# Patient Record
Sex: Female | Born: 1945
Health system: Southern US, Community
[De-identification: ages and names within clinical notes are randomized; demographics above are authoritative.]

## PROBLEM LIST (undated history)

## (undated) DIAGNOSIS — M810 Age-related osteoporosis without current pathological fracture: Secondary | ICD-10-CM

## (undated) DIAGNOSIS — R42 Dizziness and giddiness: Secondary | ICD-10-CM

## (undated) DIAGNOSIS — C679 Malignant neoplasm of bladder, unspecified: Secondary | ICD-10-CM

## (undated) DIAGNOSIS — I10 Essential (primary) hypertension: Secondary | ICD-10-CM

## (undated) DIAGNOSIS — I2510 Atherosclerotic heart disease of native coronary artery without angina pectoris: Secondary | ICD-10-CM

## (undated) HISTORY — PX: RETINAL DETACHMENT SURGERY: SHX105

## (undated) HISTORY — DX: Atherosclerotic heart disease of native coronary artery without angina pectoris: I25.10

## (undated) HISTORY — PX: ABDOMINAL HYSTERECTOMY: SHX81

## (undated) HISTORY — DX: Malignant neoplasm of bladder, unspecified: C67.9

## (undated) HISTORY — DX: Dizziness and giddiness: R42

## (undated) HISTORY — DX: Essential (primary) hypertension: I10

## (undated) HISTORY — PX: TRANSURETHRAL RESECTION OF BLADDER: SUR1395

## (undated) HISTORY — DX: Age-related osteoporosis without current pathological fracture: M81.0

---

## 2007-05-16 ENCOUNTER — Ambulatory Visit (HOSPITAL_COMMUNITY): Admission: RE | Admit: 2007-05-16 | Discharge: 2007-05-16 | Payer: Self-pay | Admitting: Ophthalmology

## 2007-06-25 ENCOUNTER — Ambulatory Visit (HOSPITAL_COMMUNITY): Admission: RE | Admit: 2007-06-25 | Discharge: 2007-06-25 | Payer: Self-pay | Admitting: Ophthalmology

## 2007-07-02 ENCOUNTER — Ambulatory Visit (HOSPITAL_COMMUNITY): Admission: RE | Admit: 2007-07-02 | Discharge: 2007-07-02 | Payer: Self-pay | Admitting: Ophthalmology

## 2007-07-23 ENCOUNTER — Ambulatory Visit (HOSPITAL_COMMUNITY): Admission: RE | Admit: 2007-07-23 | Discharge: 2007-07-23 | Payer: Self-pay | Admitting: Ophthalmology

## 2007-11-19 ENCOUNTER — Ambulatory Visit (HOSPITAL_COMMUNITY): Admission: RE | Admit: 2007-11-19 | Discharge: 2007-11-19 | Payer: Self-pay | Admitting: Ophthalmology

## 2010-08-24 NOTE — Op Note (Signed)
[name], [name]             ACCOUNT NO.:  [id number]   MEDICAL RECORD NO.:  [id number]          PATIENT TYPE:  AMB   LOCATION:  SDS                          FACILITY:  [facility]   PHYSICIAN:  Jillyn Hidden A. Rankin, M.D.   DATE OF BIRTH:  03-07-1946   DATE OF PROCEDURE:  11/19/2007  DATE OF DISCHARGE:  11/19/2007                               OPERATIVE REPORT   PREOPERATIVE DIAGNOSES:  1. Tractional detachment of right eye.  2. Rhegmatogenous detachment of right eye.  3. Retained silicone oil implant, right eye.  4. Posterior synechiae.   POSTOPERATIVE DIAGNOSES:  1. Tractional detachment of right eye.  2. Rhegmatogenous detachment of right eye.  3. Retained silicone oil implant, right eye.  4. Posterior synechiae to lens capsule from iris at the 11 o'clock      position.   PROCEDURE:  1. Posterior vitrectomy-25 gauge right eye.  2. Removal of posterior implant - oil, right eye.  3. Posterior synechialysis, right eye, in the anterior chamber.   SURGEON:  Alford Highland. Rankin, MD.   ANESTHESIA:  Local with monitored anesthesia control.   INDICATION FOR PROCEDURE:  The patient is a 65 year old woman who has  had a prior history of complex retinal detachment repair requiring use  of silicone oil in addition to vitrectomy and laser photocoagulation.  This is an attempt to remove the silicone oil so as to minimize any  toxic issues that might come from the oil but also to regain best  corrected surgical vision in the left eye.  The patient understands the  risk of anesthesia, including rare occurrence of death, but also to the  eye from the hemorrhage from the underlying condition as well as  surgical repair, including but not limited to hemorrhage, infection,  scarring, need for another surgery, no change in vision, loss of vision,  and progressive disease despite intervention.   Appropriate signed consent was obtained.  The patient was taken to  operating room.  In the operating room,  appropriate monitors followed by  mild sedation.  A 2% Xylocaine injected  5 mL retrobulbar with  additional 5 mL laterally in fashion of modified Darel Hong.  The right  periocular region was then sterilely prepped and draped in the usual  sterile  ophthalmic fashion.  Lid speculum was applied.  A 25-gauge  infusion cannula was placed in the inferotemporal quadrant.  A superior  trocar was applied.  Core vitrectomy was not necessary, had been  previously done.  The supratemporal sclerotomy was not placed at this  time.  __________ cut down, conjunctival peritomy was then fashioned and  an MVR blade was used into the vitreous cavity.  Silicone oil extraction  was then carried out through the supratemporal quadrant.  The oil was  removed without difficulty.  There were some remnants, and this was  decided to perform a fluid-air exchange.  The sclerotomy was then closed  with several Vicryl and the remaining 25-gauge trocar placed in the  supratemporal quadrant.  This allowed for closed vitrectomy system to be  undertaken.  Fluid-air exchange completed x2 to remove the  oil from the  meniscus of the BSS.  Vast majority of oil was removed in this fashion.  BSS was left in place without air.  At this time, superior trocars were  removed.  MVR blade was then used to create a small paracentesis  incision at the 6:30 position and then cycle dialysis spatula sweep was  then used to break the synechiae at 11 o'clock position, iris to the  capsule.  Free excursion in the iris was now possible.  At this time,  the anterior chamber was of appropriate depth.  The infusion is removed.  The conjunctiva was closed with 7-0 Vicryl.  Subconjunctival Decadron  applied.  Sterile patch and Fox shield applied.  The patient was taken  to short stay area, discharged home as an outpatient.      Alford Highland Rankin, M.D.  Electronically Signed     GAR/MEDQ  D:  11/19/2007  T:  11/20/2007  Job:  [id number]

## 2010-08-24 NOTE — Op Note (Signed)
[name], [name]             ACCOUNT NO.:  [id number]   MEDICAL RECORD NO.:  [id number]          PATIENT TYPE:  AMB   LOCATION:  SDS                          FACILITY:  [facility]   PHYSICIAN:  Jillyn Hidden A. Rankin, M.D.   DATE OF BIRTH:  1945-06-17   DATE OF PROCEDURE:  06/25/2007  DATE OF DISCHARGE:  06/25/2007                               OPERATIVE REPORT   PREOPERATIVE DIAGNOSIS:  1. Nuclear sclerotic cataract, OD.  2. 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   POSTOPERATIVE DIAGNOSIS:  1. Nuclear sclerotic cataract, OD.  2. 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   PROCEDURE:  Phacoemulsification and cataract extraction extracapsular  with insertion of posterior chamber intraocular lens, OD.  Alcon model  [id number] power +19.0 Diopter.  Serial number [id number].   ANESTHESIA:  Local retrobulbar and monitored anesthesia care.   SURGEON:  Alford Highland. Rankin, M.D.   INDICATIONS FOR PROCEDURE:  The patient is a 65 year old woman with  moderate nuclear sclerotic cataract in the right eye which requires  vitrectomy, repair of retinal detachment, and the cataract surgery needs  to be done first so as to allow for complete vitrectomy and removal of  membranes in the right eye in the coming week or two.  The patient  understands the risks of anesthesia including the rare occurrence of  death, but also to the eye including, but not limited to hemorrhage,  infection, scarring, need for  another surgery, no change in vision, loss  of vision, progression of disease despite intervention.  Appropriate  signed consent was obtained.   DESCRIPTION OF PROCEDURE:  The patient was taken to the operating room.  In the operating room, appropriate monitors followed by mild sedation.  2% Xylocaine injected retrobulbar 5 mL with additional 5 mL lateral  fashion modified Darel Hong.  The right periocular region was sterilely  prepped and draped in the usual ophthalmic fashion.  The lid speculum  was applied.  The diamond blade was then used to create a biplanar clear  corneal incision superotemporally.  Anterior chamber was deepened with  Provisc.  Cystotome needle was then placed.  Entry was made in the  anterior capsule and capsulorrhexis forceps were then used to create a  continuous circular capsulorrhexis without difficulty.  Hydrodissection  delineation carried out in the bag.  Phaco fragmentation was then  carried out.  Power and duration of phaco time was less than 1.38  minutes.   At  this time, IA cortical cleanup was then carried out without  difficulty.  At this time the intraocular lens was loaded, inserted into  the anterior chamber, and rotated to horizontal position with excellent  centration.  No complications occurred.  Provisc had been used to deepen  the anterior chamber and the capsular bag prior to  insertion of the lens.  This was now aspirated with IA.  Safety stitch  10-0 nylon was then placed solitarily in the clear cornea.  Miochol was  then used to close down the pupil.  Subconjunctival Decadron applied.  Sterile patch and Fox shield applied.  The patient was taken to the  short stay area and discharged home as an outpatient.      Alford Highland Rankin, M.D.  Electronically Signed     GAR/MEDQ  D:  06/25/2007  T:  06/25/2007  Job:  [id number]

## 2010-08-24 NOTE — Op Note (Signed)
[name], [name]             ACCOUNT NO.:  [id number]   MEDICAL RECORD NO.:  [id number]          PATIENT TYPE:  AMB   LOCATION:  DFTL                         FACILITY:  [facility]   PHYSICIAN:  Alford Highland. Rankin, M.D.   DATE OF BIRTH:  1945-06-25   DATE OF PROCEDURE:  05/16/2007  DATE OF DISCHARGE:  05/16/2007                               OPERATIVE REPORT   PREOPERATIVE DIAGNOSIS:  Rhegmatogenal detachment - macula off right  eye.   POSTOPERATIVE DIAGNOSIS:  Rhegmatogenal detachment - macula off right  eye.   PROCEDURE:  1. Scleral buckle using a 270, 287, and 40 element with retinal      cryopexy.  2. External drainage of subretinal fluid.   INDICATIONS FOR PROCEDURE:  The patient is a 65 year old woman who has  profound vision loss in the right eye on the basis on rhegmatogenal  detachment.  This is an attempt to reattach the retina.  She understands  the fundamental nature of the disorder and the requirement to repair it  to put the retina back in place so the healing process can allow for  visual acuity improvement over the next weeks to months.  She  understands the risks of anesthesia including the rare occurrence of  death but also to the eye including but not limited to hemorrhage,  infection, scarring, need for further surgery, no change in vision, loss  of vision, and progression of disease despite intervention.  After  appropriate signed consent was obtained, the patient was taken to the  operating room.   DESCRIPTION OF PROCEDURE:  In the operating room put monitors followed  by mild sedation.  General endotracheal anesthesia was then instituted.  The right periocular region was identified and then sterilely prepped  and draped in the usual opthalmic fashion.  Lid speculum applied.  Conjunctival peritomy was then fashioned 360 degrees.  Rectus muscles  isolated on 2-0 silk ties.  Indirect ophthalmoscopy was then performed  which confirmed the presence of a retinal  tear at the 10:30 position.  This large retinal tear was treated with retinal cryopexy.  Suspicious  tear was noted at 12 o'clock.  Detachment extended from the 8 o'clock  position superiorly around to the 12:30 position but the macula was off  and the highly bullous nature of the detachment had diminished  substantially.  287 silicon explant was selected and placed from the 7  o'clock position temporally to the 12 and then over to the 2 o'clock  position.  A 240 encircling band was used and it was joined end-to-end  on the inferonasal quadrant with a 70 Watzke sleeve.  At this time,  external drainage of subretinal fluid was then carried out with  visualization via the ophthalmoscopy.  This was carried out in the  inferotemporal quadrant.  Very little bit of fluid was remaining and the  tear settled nicely on the buckle and all subretinal fluid visible was  removed.   At this time, bed of the buckle was irrigated with bug juice.  Buckle  was tightened to the appropriate tension and the sutures were tied  permanently.  Two sutures were placed in the superotemporal quadrant,  one in each of the remaining quadrants.  At this time, optic nerve was  checked and it was found to be profuse.  The conjunctiva was  then brought forward and closed with interrupted 7-0 Vicryl sutures with  tenons as well.  Subconjunctival Decadron applied.  Sterile patch and  Fox shield were applied to the right eye.  The patient tolerated the  procedure without complications.  The patient was then taken to the PACU  in good stable condition.      Alford Highland Rankin, M.D.  Electronically Signed     GAR/MEDQ  D:  05/16/2007  T:  05/17/2007  Job:  [id number]   cc:   Dorris Fetch, M.D.

## 2010-08-24 NOTE — Op Note (Signed)
NAMEABYGAYLE, DELTORO             ACCOUNT NO.:  [id number]   MEDICAL RECORD NO.:  [id number]          PATIENT TYPE:  AMB   LOCATION:  SDS                          FACILITY:  [facility]   PHYSICIAN:  Jillyn Hidden A. Rankin, M.D.   DATE OF BIRTH:  05/13/1945   DATE OF PROCEDURE:  07/23/2007  DATE OF DISCHARGE:  07/23/2007                               OPERATIVE REPORT   PREOPERATIVE DIAGNOSES:  1. Tractional detachment, right eye.  2. Proliferative vitreoretinopathy stage C2 right eye, recurrent      retinal detachment.  3. Rhegmatogenous detachment, right eye, combined with proliferative      vitreoretinopathy stage C2 right eye recurrent retinal detachment.   POSTOPERATIVE DIAGNOSES:  1. Tractional detachment, right eye.  2. Proliferative vitreoretinopathy stage C2 right eye, recurrent      retinal detachment.  3. Rhegmatogenous detachment, right eye, combined with proliferative      vitreoretinopathy stage C2 right eye recurrent retinal detachment.   PROCEDURES:  1. Posterior vitrectomy with membrane peel - to repair retinal      detachment, complex.  2. Injection of vitreous substitute - air.  3. Focal laser photocoagulation.  4. Injection of vitreous substitute permanent - silicone oil 5000      [place], the right eye.  This is all 20-gauge surgery.   SURGEON:  Alford Highland. Rankin, M.D.   ANESTHESIA:  Local retrobulbar, monitored anesthesia control.   INDICATIONS FOR PROCEDURE:  The patient is a 65 year old woman who has  recurrent combined rhegmatogenous detachment and tractional detachment,  right eye.  The patient had recent vitrectomy membrane peel with  placement of gas and redetachment developed with new retinal breaks off  the slope of the buckle temporarily.  This is an attempt to reattach the  retina.  She understands the risks of anesthesia including rare  occurrence of death, loss of the eye, including but not limited to  hemorrhage, infection, scarring, need for  another  surgery, no change in  vision, loss of vision, and progressive disease despite intervention.  After appropriate signed consent was obtained, the patient taken to the  operating room.  In the operating room, appropriate monitors followed by  mild sedation.  Xylocaine  2% 5 mL was injected retrobulbar, followed by  an additional  5 mL laterally in the fashion of modified Darel Hong.  The  right periocular region was sterilely prepped and draped usual  ophthalmic fashion.  A lid speculum was applied.  Conjunctival openings  were made inferotemporal, superotemporal, and superonasal.  A 20 gauge  infusion cast 3.5 mm posterior to the limbus.  Placement of vitreous  cavity verified visually.  Superior sclerotomies fashioned.  Microscope  was  was placed in position with the biome attachment.  Core vitrectomy  had been previously done and thus attention was drawn to vitreous skirt.  It was noted that there is excellent vitreous skirt dissection  from  previous surgery.  There is very little in the way of epiretinal tissue,  but there was rounded edges to the retinal hole at the 8:30 position  signifying development of  proliferative vitreoretinopathy.  The  inferior retinal detachment was noted.  A retinotomy was fashioned nasal  to the optic nerve.  This allowed the areas of subretinal fluid.  Thick  subretinal fluid had been carried out and drained in a fluid-fluid  maneuver prior to fluid-air exchange.  Fluid air exchange carried out.  All subretinal fluid was removed in this fashion.  This time endolaser  photocoagulation placed at the bed attachment as well as around  chorioretinal scars around the retinal break noted.  Excellent laser  adhesion appeared to be taking.  Laser was applied around the retinotomy  sites.  Thereafter multiple reaccumulation of fluids were aspirated.  Thereafter, air-silicone oil exchange completed passively.  The retina  remained nicely reattached.  Superior  sclerotomy was closed with  7-0  Vicryl.  The infusion removed similarly closed.  Conjunctiva closed with  7-0 Vicryl.  At this time,subconjunctival Decadron applied.  Sterile  patch and Fox shield applied.  The patient taken to short-stay area  after tolerating procedure without complication.      Alford Highland Rankin, M.D.  Electronically Signed     GAR/MEDQ  D:  07/23/2007  T:  07/24/2007  Job:  [id number]

## 2010-08-24 NOTE — Op Note (Signed)
[name], [name]             ACCOUNT NO.:  [id number]   MEDICAL RECORD NO.:  [id number]          PATIENT TYPE:  AMB   LOCATION:  SDS                          FACILITY:  [facility]   PHYSICIAN:  [name], M.D.   DATE OF BIRTH:  Jun 12, 1945   DATE OF PROCEDURE:  07/02/2007  DATE OF DISCHARGE:                               OPERATIVE REPORT   PREOPERATIVE DIAGNOSES:  1. Tractional detachment, right eye.  2. Proliferative visual retinopathy stage C2, inferior detachment      macula on.   POSTOPERATIVE DIAGNOSES:  1. Tractional detachment, right eye.  2. Proliferative vitreal retinopathy stage C2, inferior detachment      macula on.   PROCEDURES:  1. Posterior vitrectomy, membrane peel.  2. Repair of retinal detachment, right eye and star fold that is found      at the 7:30 position off the slope of the buckle.  3. Endolaser pan photocoagulation into the bed of detachment as well      as around the retina to be fashioned outside the inferotemporal      arcade.  4. Injection of vitreous __________ C3F8 10%.   SURGEON:  Heidi Kirk, MD.   ANESTHESIA:  Local retrobulbar monitored anesthesia control.   INDICATION FOR PROCEDURE:  The patient is a 65 year old woman who has  profound vision loss in the right eye on the basis of retinal detachment  which has recurred and then subsequently required cataract extraction  with intraocular lens placement last week in order to allow appropriate  proliferative vitreal retinopathy treatment via vitrectomy, dissection  of the vitreous base, release of the tractional folds.  The patient  understands the risk of this surgery to repair the retinal detachment  and the urgency to repair the retinal detachment in order to improve  visual functioning and maintain long term visual care.  She understands  the risks of anesthesia including the risk of death, but also to the eye  including, but not limited to, hemorrhage, infection, scarring, need  for  another surgery, no change in vision, loss of vision, progressive  disease despite intervention.   OPERATIVE NOTE:  After appropriate signed consent was obtained, the  patient was taken to the operating room.  In the operating room  appropriate monitors followed by mild sedation.  Xylocaine 2% injected 5  mL retrobulbar with an additional 5 mL laterally __________ modified [name].  The right periocular region was sterilely prepped and draped in  the usual ophthalmic fashion.  A lid speculum applied.  At this time a  conjunctival peritomy was then fashioned temporally and superonasally.  Infusion cannula was secured 3.5 mm posterior to the limbus in the upper  __________.  Placement of the vitreous cavity verified visually.  Superior sclerotomies were then fashioned __________ attached.  Core  vitrectomy was then begun after the superior sclerotomies had been  fashioned.  No findings of significant pigment dispersion.   The vitreous base was then trimmed 360 degrees using __________  techniques.  The anterior hyaloid was confirmed to be removed.  At this  time a small retinotomy along  the __________ temporal arcade was  created.  Fluid exchange was then carried out to help flatten the  retina.  At this time a star fold along the __________ buckle, which had  been identified preoperatively, was engaged and forceps extraction was  then used to mobilize __________ tissues as well as to develop some  mobility, freedom in this area for the retina.   At this time, fluid air exchange completed.  All subretinal fluid was  removed.  Endolaser photocoagulation and a retinopexy __________  detachment posterior to the pars plana, along the pars plana and on the  bed of detachment around the retinotomy site was then carried out.  All  fluid was removed.  The retina remained nicely attached.  An AR-C3F8 10%  exchange was then completed.  Superior sclerotomy was closed with 7-0  Vicryl.   __________ similarly closed.  Conjunctiva closed with 7-0  Vicryl.  Subconjunctival injections of __________ were applied.  A  sterile patch and Fox shield applied.  Intraocular pressure assessed,  found to be adequate.  The patient was taken to the PACU and then to  Short Stay in good stable condition after tolerating procedure well, no  complications.      [name], M.D.  Electronically Signed     GAR/MEDQ  D:  07/02/2007  T:  07/02/2007  Job:  [id number]

## 2010-12-31 LAB — BASIC METABOLIC PANEL
BUN: 10
CO2: 24
Chloride: 106
GFR calc non Af Amer: >60
Glucose, Bld: 95
Potassium: 4.1

## 2010-12-31 LAB — CBC
HCT: 39.6
MCHC: 34.4
MCV: 93.5
Platelets: 263
RDW: 12.9

## 2011-01-03 LAB — BASIC METABOLIC PANEL
BUN: 10
Calcium: 10.4
Creatinine, Ser: 0.66
GFR calc non Af Amer: >60
Glucose, Bld: 103 — ABNORMAL HIGH

## 2011-01-03 LAB — CBC
Platelets: 296
RDW: 12.7
WBC: 9.5

## 2011-01-04 LAB — BASIC METABOLIC PANEL
Chloride: 106
GFR calc Af Amer: >60
GFR calc non Af Amer: >60
Potassium: 4.5

## 2011-01-04 LAB — CBC
HCT: 39.1
MCV: 92.3
Platelets: 304
RBC: 4.24
WBC: 8.1

## 2011-01-07 LAB — CBC
HCT: 40.4
Hemoglobin: 13.5
MCV: 93.5
Platelets: 252
WBC: 8.5

## 2011-01-07 LAB — BASIC METABOLIC PANEL
BUN: 9
Chloride: 105
Creatinine, Ser: 0.75
Glucose, Bld: 95
Potassium: 4.7

## 2012-10-23 ENCOUNTER — Encounter (HOSPITAL_COMMUNITY): Payer: Self-pay | Admitting: Pharmacy Technician

## 2012-10-23 NOTE — Patient Instructions (Addendum)
AVALON COPPINGER  10/23/2012   Your procedure is scheduled on: 10/30/12  Report to [facility] at 0730 AM.  Call this number if you have problems the morning of surgery: [phone]   Remember:   Do not eat food or drink liquids after midnight.   Take these medicines the morning of surgery with A SIP OF WATER: lisinopril   Do not wear jewelry, make-up or nail polish.  Do not wear lotions, powders, or perfumes. You may wear deodorant.  Do not shave 48 hours prior to surgery. Men may shave face and neck.  Do not bring valuables to the hospital.  [facility] is not responsible                   for any belongings or valuables.  Contacts, dentures or bridgework may not be worn into surgery.  Leave suitcase in the car. After surgery it may be brought to your room.  For patients admitted to the hospital, checkout time is 11:00 AM the day of  discharge.   Patients discharged the day of surgery will not be allowed to drive  home.  Name and phone number of your driver: family  Special Instructions: Shower using CHG 2 nights before surgery and the night before surgery.  If you shower the day of surgery use CHG.  Use special wash - you have one bottle of CHG for all showers.  You should use approximately 1/3 of the bottle for each shower.   Please read over the following fact sheets that you were given: Pain Booklet, Surgical Site Infection Prevention, Anesthesia Post-op Instructions and Care and Recovery After Surgery  Cataract A cataract is a clouding of the lens of the eye. When a lens becomes cloudy, vision is reduced based on the degree and nature of the clouding. Many cataracts reduce vision to some degree. Some cataracts make people more near-sighted as they develop. Other cataracts increase glare. Cataracts that are ignored and become worse can sometimes look white. The white color can be seen through the pupil. CAUSES   Aging. However, cataracts may occur at any age, even in  newborns.  Certain drugs.  Trauma to the eye.  Certain diseases such as diabetes.  Specific eye diseases such as chronic inflammation inside the eye or a sudden attack of a rare form of glaucoma.  Inherited or acquired medical problems. SYMPTOMS   Gradual, progressive drop in vision in the affected eye.  Severe, rapid visual loss. This most often happens when trauma is the cause. DIAGNOSIS  To detect a cataract, an eye doctor examines the lens. Cataracts are best diagnosed with an exam of the eyes with the pupils enlarged (dilated) by drops.  TREATMENT  For an early cataract, vision may improve by using different eyeglasses or stronger lighting. If that does not help your vision, surgery is the only effective treatment. A cataract needs to be surgically removed when vision loss interferes with your everyday activities, such as driving, reading, or watching TV. A cataract may also have to be removed if it prevents examination or treatment of another eye problem. Surgery removes the cloudy lens and usually replaces it with a substitute lens (intraocular lens, IOL).  At a time when both you and your doctor agree, the cataract will be surgically removed. If you have cataracts in both eyes, only one is usually removed at a time. This allows the operated eye to heal and be out of danger from any possible problems after  surgery (such as infection or poor wound healing). In rare cases, a cataract may be doing damage to your eye. In these cases, your caregiver may advise surgical removal right away. The vast majority of people who have cataract surgery have better vision afterward. HOME CARE INSTRUCTIONS  If you are not planning surgery, you may be asked to do the following:  Use different eyeglasses.  Use stronger or brighter lighting.  Ask your eye doctor about reducing your medicine dose or changing medicines if it is thought that a medicine caused your cataract. Changing medicines does not  make the cataract go away on its own.  Become familiar with your surroundings. Poor vision can lead to injury. Avoid bumping into things on the affected side. You are at a higher risk for tripping or falling.  Exercise extreme care when driving or operating machinery.  Wear sunglasses if you are sensitive to bright light or experiencing problems with glare. SEEK IMMEDIATE MEDICAL CARE IF:   You have a worsening or sudden vision loss.  You notice redness, swelling, or increasing pain in the eye.  You have a fever. Document Released: 03/28/2005 Document Revised: 06/20/2011 Document Reviewed: 11/19/2010 [facility] Patient Information 2014 [place], [place].  PATIENT INSTRUCTIONS POST-ANESTHESIA  IMMEDIATELY FOLLOWING SURGERY:  Do not drive or operate machinery for the first twenty four hours after surgery.  Do not make any important decisions for twenty four hours after surgery or while taking narcotic pain medications or sedatives.  If you develop intractable nausea and vomiting or a severe headache please notify your doctor immediately.  FOLLOW-UP:  Please make an appointment with your surgeon as instructed. You do not need to follow up with anesthesia unless specifically instructed to do so.  WOUND CARE INSTRUCTIONS (if applicable):  Keep a dry clean dressing on the anesthesia/puncture wound site if there is drainage.  Once the wound has quit draining you may leave it open to air.  Generally you should leave the bandage intact for twenty four hours unless there is drainage.  If the epidural site drains for more than 36-48 hours please call the anesthesia department.  QUESTIONS?:  Please feel free to call your physician or the hospital operator if you have any questions, and they will be happy to assist you.

## 2012-10-24 ENCOUNTER — Encounter (HOSPITAL_COMMUNITY): Payer: Self-pay

## 2012-10-24 ENCOUNTER — Encounter (HOSPITAL_COMMUNITY)
Admission: RE | Admit: 2012-10-24 | Discharge: 2012-10-24 | Disposition: A | Discharge status: Home / Self Care | Payer: BC Managed Care – PPO | Source: Ambulatory Visit | Attending: Ophthalmology | Admitting: Ophthalmology

## 2012-10-24 DIAGNOSIS — Z01810 Encounter for preprocedural cardiovascular examination: Secondary | ICD-10-CM | POA: Insufficient documentation

## 2012-10-24 DIAGNOSIS — Z01812 Encounter for preprocedural laboratory examination: Secondary | ICD-10-CM | POA: Insufficient documentation

## 2012-10-24 LAB — BASIC METABOLIC PANEL
BUN: 15 mg/dL (ref 6–23)
CO2: 28 mEq/L (ref 19–32)
Chloride: 99 mEq/L (ref 96–112)
Creatinine, Ser: 0.75 mg/dL (ref 0.50–1.10)
Glucose, Bld: 92 mg/dL (ref 70–99)
Potassium: 4 mEq/L (ref 3.5–5.1)

## 2012-10-24 LAB — HEMOGLOBIN AND HEMATOCRIT, BLOOD: Hemoglobin: 12.9 g/dL (ref 12.0–15.0)

## 2012-10-25 ENCOUNTER — Other Ambulatory Visit (HOSPITAL_COMMUNITY): Payer: Self-pay

## 2012-10-29 MED ORDER — TETRACAINE HCL 0.5 % OP SOLN
OPHTHALMIC | Status: AC
  Filled 2012-10-29: qty 2

## 2012-10-29 MED ORDER — FLURBIPROFEN SODIUM 0.03 % OP SOLN
OPHTHALMIC | Status: AC
  Filled 2012-10-29: qty 2.5

## 2012-10-29 MED ORDER — CYCLOPENTOLATE-PHENYLEPHRINE 0.2-1 % OP SOLN
OPHTHALMIC | Status: AC
  Filled 2012-10-29: qty 2

## 2012-10-30 ENCOUNTER — Ambulatory Visit (HOSPITAL_COMMUNITY): Payer: BC Managed Care – PPO | Admitting: Anesthesiology

## 2012-10-30 ENCOUNTER — Encounter (HOSPITAL_COMMUNITY)
Admission: RE | Disposition: A | Discharge status: Home Health | Payer: Self-pay | Source: Ambulatory Visit | Attending: Ophthalmology

## 2012-10-30 ENCOUNTER — Ambulatory Visit (HOSPITAL_COMMUNITY)
Admission: RE | Admit: 2012-10-30 | Discharge: 2012-10-30 | Disposition: A | Discharge status: Home Health | Payer: BC Managed Care – PPO | Source: Ambulatory Visit | Attending: Ophthalmology | Admitting: Ophthalmology

## 2012-10-30 ENCOUNTER — Encounter (HOSPITAL_COMMUNITY): Payer: Self-pay | Admitting: *Deleted

## 2012-10-30 ENCOUNTER — Encounter (HOSPITAL_COMMUNITY): Payer: Self-pay | Admitting: Anesthesiology

## 2012-10-30 DIAGNOSIS — I252 Old myocardial infarction: Secondary | ICD-10-CM | POA: Insufficient documentation

## 2012-10-30 DIAGNOSIS — Z79899 Other long term (current) drug therapy: Secondary | ICD-10-CM | POA: Insufficient documentation

## 2012-10-30 DIAGNOSIS — H2510 Age-related nuclear cataract, unspecified eye: Secondary | ICD-10-CM | POA: Insufficient documentation

## 2012-10-30 DIAGNOSIS — I10 Essential (primary) hypertension: Secondary | ICD-10-CM | POA: Insufficient documentation

## 2012-10-30 HISTORY — PX: CATARACT EXTRACTION W/PHACO: SHX586

## 2012-10-30 SURGERY — PHACOEMULSIFICATION, CATARACT, WITH IOL INSERTION
Anesthesia: Monitor Anesthesia Care | Site: Eye | Laterality: Left | Wound class: Clean

## 2012-10-30 MED ORDER — PHENYLEPHRINE HCL 2.5 % OP SOLN
1.0000 [drp] | OPHTHALMIC | Status: AC
  Administered 2012-10-30 (×3): 1 [drp] via OPHTHALMIC

## 2012-10-30 MED ORDER — CYCLOPENTOLATE-PHENYLEPHRINE 0.2-1 % OP SOLN
1.0000 [drp] | OPHTHALMIC | Status: AC
  Administered 2012-10-30 (×3): 1 [drp] via OPHTHALMIC

## 2012-10-30 MED ORDER — MIDAZOLAM HCL 2 MG/2ML IJ SOLN
1.0000 mg | INTRAMUSCULAR | Status: DC | PRN
  Administered 2012-10-30: 2 mg via INTRAVENOUS

## 2012-10-30 MED ORDER — EPINEPHRINE HCL 1 MG/ML IJ SOLN
INTRAOCULAR | Status: DC | PRN
  Administered 2012-10-30: 10:00:00

## 2012-10-30 MED ORDER — FENTANYL CITRATE 0.05 MG/ML IJ SOLN
INTRAMUSCULAR | Status: AC
  Administered 2012-10-30: 25 ug via INTRAVENOUS
  Filled 2012-10-30: qty 2

## 2012-10-30 MED ORDER — LACTATED RINGERS IV SOLN
INTRAVENOUS | Status: DC
  Administered 2012-10-30: 09:00:00 via INTRAVENOUS

## 2012-10-30 MED ORDER — TETRACAINE HCL 0.5 % OP SOLN
1.0000 [drp] | OPHTHALMIC | Status: AC
  Administered 2012-10-30 (×3): 1 [drp] via OPHTHALMIC

## 2012-10-30 MED ORDER — PROVISC 10 MG/ML IO SOLN
INTRAOCULAR | Status: DC | PRN
  Administered 2012-10-30: 8.5 mg via INTRAOCULAR

## 2012-10-30 MED ORDER — BSS IO SOLN
INTRAOCULAR | Status: DC | PRN
  Administered 2012-10-30: 15 mL via INTRAOCULAR

## 2012-10-30 MED ORDER — POVIDONE-IODINE 5 % OP SOLN
OPHTHALMIC | Status: DC | PRN
  Administered 2012-10-30: 1 via OPHTHALMIC

## 2012-10-30 MED ORDER — FLURBIPROFEN SODIUM 0.03 % OP SOLN
1.0000 [drp] | OPHTHALMIC | Status: AC
  Administered 2012-10-30 (×3): 1 [drp] via OPHTHALMIC

## 2012-10-30 MED ORDER — MIDAZOLAM HCL 2 MG/2ML IJ SOLN
INTRAMUSCULAR | Status: AC
  Filled 2012-10-30: qty 2

## 2012-10-30 MED ORDER — FENTANYL CITRATE 0.05 MG/ML IJ SOLN: 25.0000 ug | INTRAMUSCULAR | Status: AC

## 2012-10-30 SURGICAL SUPPLY — 23 items

## 2012-10-30 NOTE — H&P (Signed)
The patient was re examined and there is no change in the patients condition since the original H and P. 

## 2012-10-30 NOTE — Anesthesia Postprocedure Evaluation (Signed)
  Anesthesia Post-op Note  Patient: [name]  Procedure(s) Performed: Procedure(s) with comments: CATARACT EXTRACTION PHACO AND INTRAOCULAR LENS PLACEMENT (IOC) (Left) - CDE:6.00  Patient Location: Short Stay  Anesthesia Type:MAC  Level of Consciousness: awake, alert , oriented and patient cooperative  Airway and Oxygen Therapy: Patient Spontanous Breathing  Post-op Pain: none  Post-op Assessment: Post-op Vital signs reviewed, Patient's Cardiovascular Status Stable, Respiratory Function Stable, Patent Airway, No signs of Nausea or vomiting and Pain level controlled  Post-op Vital Signs: Reviewed and stable  Complications: No apparent anesthesia complications

## 2012-10-30 NOTE — Op Note (Signed)
Patient brought to the operating room and prepped and draped in the usual manner.  Lid speculum inserted in left eye.  Stab incision made at the twelve o'clock position.  Provisc instilled in the anterior chamber.   A 2.4 mm. Stab incision was made temporally.  An anterior capsulotomy was done with a bent 25 gauge needle.  The nucleus was hydrodissected.  The Phaco tip was inserted in the anterior chamber and the nucleus was emulsified.  CDE was 6.00.  The cortical material was then removed with the I and A tip.  Posterior capsule was the polished.  The anterior chamber was deepened with Provisc.  A 18.0 Diopter Rayner 570C IOL was then inserted in the capsular bag.  Provisc was then removed with the I and A tip.  The wound was then hydrated.  Patient sent to the Recovery Room in good condition with follow up in my office.  Preoperative Diagnosis:  Nuclear Cataract OS Postoperative Diagnosis:  Same Procedure name: Kelman Phacoemulsification OS with IOL

## 2012-10-30 NOTE — Anesthesia Preprocedure Evaluation (Signed)
Anesthesia Evaluation  Patient identified by MRN, date of birth, ID band Patient awake    Reviewed: Allergy & Precautions, H&P , NPO status , Patient's Chart, lab work & pertinent test results  Airway Mallampati: II TM Distance: >3 FB     Dental  (+) Teeth Intact   Pulmonary neg pulmonary ROS,  breath sounds clear to auscultation        Cardiovascular hypertension, Rhythm:Regular Rate:Normal     Neuro/Psych    GI/Hepatic negative GI ROS,   Endo/Other    Renal/GU      Musculoskeletal   Abdominal   Peds  Hematology   Anesthesia Other Findings   Reproductive/Obstetrics                           Anesthesia Physical Anesthesia Plan  ASA: II  Anesthesia Plan: MAC   Post-op Pain Management:    Induction: Intravenous  Airway Management Planned: Nasal Cannula  Additional Equipment:   Intra-op Plan:   Post-operative Plan:   Informed Consent: I have reviewed the patients History and Physical, chart, labs and discussed the procedure including the risks, benefits and alternatives for the proposed anesthesia with the patient or authorized representative who has indicated his/her understanding and acceptance.     Plan Discussed with:   Anesthesia Plan Comments:         Anesthesia Quick Evaluation

## 2012-10-30 NOTE — Transfer of Care (Signed)
Immediate Anesthesia Transfer of Care Note  Patient: [name]  Procedure(s) Performed: Procedure(s) with comments: CATARACT EXTRACTION PHACO AND INTRAOCULAR LENS PLACEMENT (IOC) (Left) - CDE:6.00  Patient Location: Short Stay  Anesthesia Type:MAC  Level of Consciousness: awake, alert  and patient cooperative  Airway & Oxygen Therapy: Patient Spontanous Breathing  Post-op Assessment: Report given to PACU RN, Post -op Vital signs reviewed and stable and Patient moving all extremities  Post vital signs: Reviewed and stable  Complications: No apparent anesthesia complications

## 2012-10-31 ENCOUNTER — Encounter (HOSPITAL_COMMUNITY): Payer: Self-pay | Admitting: Ophthalmology

## 2013-11-19 ENCOUNTER — Telehealth: Payer: Self-pay | Admitting: Cardiology

## 2013-11-19 ENCOUNTER — Ambulatory Visit (INDEPENDENT_AMBULATORY_CARE_PROVIDER_SITE_OTHER): Payer: Medicare Other | Admitting: Cardiology

## 2013-11-19 ENCOUNTER — Encounter: Payer: Self-pay | Admitting: *Deleted

## 2013-11-19 ENCOUNTER — Other Ambulatory Visit: Payer: Self-pay | Admitting: *Deleted

## 2013-11-19 ENCOUNTER — Encounter: Payer: Self-pay | Admitting: Cardiology

## 2013-11-19 VITALS — BP 170/93 | HR 56 | Ht 62.0 in | Wt 116.0 lb

## 2013-11-19 DIAGNOSIS — I10 Essential (primary) hypertension: Secondary | ICD-10-CM | POA: Insufficient documentation

## 2013-11-19 DIAGNOSIS — R0602 Shortness of breath: Secondary | ICD-10-CM | POA: Insufficient documentation

## 2013-11-19 NOTE — Telephone Encounter (Signed)
Exercise echo (on medications) dx:SOB Scheduled at [facility] on Aug 18th @ 9:30 arrive at 8:30

## 2013-11-19 NOTE — Assessment & Plan Note (Signed)
Sounds like she may well have whitecoat hypertension, supported by her blood pressure today. Home blood pressure checks look much better. She was also having possible symptomatic hypotension on higher doses of antihypertensives. No changes are made today. I did discuss with her the importance of sodium restriction, continue exercise regimen.

## 2013-11-19 NOTE — Assessment & Plan Note (Signed)
Intermittent as outlined above. She is not aware of any major abnormalities uncovered by the recent echocardiogram done at [facility], we are requesting the results to review. She does have a history of hypertension (llikely whitecoat hypertension), also family history of premature CAD her father. Prior ECG nonspecific. We will proceed with an exercise echocardiogram for ischemic workup, plan to inform her of the results and need for any additional testing if required.

## 2013-11-19 NOTE — Telephone Encounter (Signed)
Approved and in epic °

## 2013-11-19 NOTE — Patient Instructions (Signed)
Your physician recommends that you continue on your current medications as directed. Please refer to the Current Medication list given to you today. Your physician has requested that you have a stress echocardiogram. For further information please visit [email or web address]. Please follow instruction sheet as given. We will call you with your results.  

## 2013-11-19 NOTE — Progress Notes (Signed)
Clinical Summary Ms. Alcorta is a 68 y.o.female referred for cardiology consultation by Dr. Woody Seller. I reviewed the available records. She tells me that back in May she was experiencing episodes of a fullness in her head associated with dizziness, often when standing, sometimes in association with low normal to low blood pressures. She has had evidence of probable whitecoat hypertension, with elevated blood pressures on office visits, otherwise home blood pressure checks reporting systolics in the 470 to 962 range. She has had to cut back Prinzide, and actually her symptoms have improved although not completely resolved. In addition she has experienced some shortness of breath with activity, usually it is brief at the initiation of activity however. She is active, walks for exercise, push mows her yard. She works in Geologist, engineering at [employer].  She tells me that she had an echocardiogram done recently at [facility]. We are requesting the results.  Copy of previous ECG reviewed finding sinus bradycardia with leftward axis.  She states her father died suddenly with a myocardial infarction at age 64.  She has not undergone a prior ischemic cardiac testing.  Allergies  Allergen Reactions  . Codeine     Makes "jittery"    Current Outpatient Prescriptions  Medication Sig Dispense Refill  . calcium-vitamin D (OSCAL WITH D) 500-200 MG-UNIT per tablet Take 1 tablet by mouth daily.      Marland Kitchen lisinopril-hydrochlorothiazide (PRINZIDE,ZESTORETIC) 10-12.5 MG per tablet Take 1 tablet by mouth daily.       No current facility-administered medications for this visit.    Past Medical History  Diagnosis Date  . Essential hypertension, benign   . Bladder cancer   . Vertigo   . Osteoporosis     Past Surgical History  Procedure Laterality Date  . Abdominal hysterectomy    . Transurethral resection of bladder  1999?    Dr. [place] Pink, [facility]  . Retinal detachment surgery Right   . Cataract extraction  w/phaco Left 10/30/2012    Procedure: CATARACT EXTRACTION PHACO AND INTRAOCULAR LENS PLACEMENT (IOC);  Surgeon: Elta Guadeloupe T. Gershon Crane, MD;  Location: [facility];  Service: Ophthalmology;  Laterality: Left;  CDE:6.00    Family History  Problem Relation Age of Onset  . Heart attack Father     Age 31  . Dementia Mother     Social History Ms. Binning reports that she quit smoking about 10 years ago. She does not have any smokeless tobacco history on file. Ms. Zuckerman reports that she does not drink alcohol.  Review of Systems No palpitations or syncope. Stable appetite. No orthopnea or PND. No leg edema. No claudication. No melena or hematochezia. He worsens reviewed and negative.  Physical Examination Filed Vitals:   11/19/13 0834  BP: 170/93  Pulse: 56   Filed Weights   11/19/13 0834  Weight: 116 lb (52.617 kg)   Normally nourished appearing woman, comfortable at rest. HEENT: Conjunctiva and lids normal, oropharynx clear. Neck: Supple, no elevated JVP or carotid bruits, no thyromegaly. Lungs: Clear to auscultation, nonlabored breathing at rest. Cardiac: Regular rate and rhythm, no S3, soft apical systolic murmur, no pericardial rub. Abdomen: Soft, nontender, bowel sounds present, no guarding or rebound. Extremities: No pitting edema, distal pulses 2+. Skin: Warm and dry. Musculoskeletal: No kyphosis. Neuropsychiatric: Alert and oriented x3, affect grossly appropriate.   Problem List and Plan   Shortness of breath Intermittent as outlined above. She is not aware of any major abnormalities uncovered by the recent echocardiogram done at  [facility], we are requesting the results to review. She does have a history of hypertension (llikely whitecoat hypertension), also family history of premature CAD her father. Prior ECG nonspecific. We will proceed with an exercise echocardiogram for ischemic workup, plan to inform her of the results and need for any additional testing if  required.  Essential hypertension, benign Sounds like she may well have whitecoat hypertension, supported by her blood pressure today. Home blood pressure checks look much better. She was also having possible symptomatic hypotension on higher doses of antihypertensives. No changes are made today. I did discuss with her the importance of sodium restriction, continue exercise regimen.    Satira Sark, M.D., F.A.C.C.

## 2013-11-22 ENCOUNTER — Telehealth: Payer: Self-pay | Admitting: *Deleted

## 2013-11-22 NOTE — Telephone Encounter (Signed)
Message copied by Merlene Laughter on Fri Nov 22, 2013  9:59 AM ------      Message from: Satira Sark      Created: Tue Nov 19, 2013  9:54 AM       Patient seen as a consult earlier today. Echocardiogram report reviewed. Normal LVEF at 60-65% with normal diastolic function. Moderate mitral and tricuspid regurgitation noted as well as mild aortic regurgitation. Not clear that these other abnormalities are causing her symptoms, but they will need to be followed over time. No change in current plan to proceed with stress test. ------

## 2013-11-25 NOTE — Telephone Encounter (Signed)
Patient informed. 

## 2013-11-25 NOTE — Telephone Encounter (Signed)
Message copied by Merlene Laughter on Mon Nov 25, 2013  3:33 PM ------      Message from: Satira Sark      Created: Tue Nov 19, 2013  9:54 AM       Patient seen as a consult earlier today. Echocardiogram report reviewed. Normal LVEF at 60-65% with normal diastolic function. Moderate mitral and tricuspid regurgitation noted as well as mild aortic regurgitation. Not clear that these other abnormalities are causing her symptoms, but they will need to be followed over time. No change in current plan to proceed with stress test. ------

## 2013-11-26 ENCOUNTER — Ambulatory Visit (INDEPENDENT_AMBULATORY_CARE_PROVIDER_SITE_OTHER): Payer: Medicare Other | Admitting: Cardiology

## 2013-11-26 ENCOUNTER — Encounter: Payer: Self-pay | Admitting: Cardiology

## 2013-11-26 ENCOUNTER — Ambulatory Visit (HOSPITAL_COMMUNITY)
Admission: RE | Admit: 2013-11-26 | Discharge: 2013-11-26 | Disposition: A | Discharge status: Home / Self Care | Payer: Medicare Other | Source: Ambulatory Visit | Attending: Cardiology | Admitting: Cardiology

## 2013-11-26 ENCOUNTER — Other Ambulatory Visit: Payer: Self-pay | Admitting: Cardiology

## 2013-11-26 ENCOUNTER — Encounter (HOSPITAL_COMMUNITY): Payer: Self-pay

## 2013-11-26 ENCOUNTER — Telehealth: Payer: Self-pay | Admitting: *Deleted

## 2013-11-26 VITALS — BP 118/62 | HR 60 | Wt 115.0 lb

## 2013-11-26 DIAGNOSIS — R0602 Shortness of breath: Secondary | ICD-10-CM | POA: Insufficient documentation

## 2013-11-26 DIAGNOSIS — I259 Chronic ischemic heart disease, unspecified: Secondary | ICD-10-CM | POA: Diagnosis not present

## 2013-11-26 DIAGNOSIS — R9439 Abnormal result of other cardiovascular function study: Secondary | ICD-10-CM

## 2013-11-26 DIAGNOSIS — I10 Essential (primary) hypertension: Secondary | ICD-10-CM | POA: Insufficient documentation

## 2013-11-26 DIAGNOSIS — Z01818 Encounter for other preprocedural examination: Secondary | ICD-10-CM

## 2013-11-26 DIAGNOSIS — R072 Precordial pain: Secondary | ICD-10-CM

## 2013-11-26 DIAGNOSIS — Z8249 Family history of ischemic heart disease and other diseases of the circulatory system: Secondary | ICD-10-CM | POA: Insufficient documentation

## 2013-11-26 DIAGNOSIS — Z87891 Personal history of nicotine dependence: Secondary | ICD-10-CM | POA: Insufficient documentation

## 2013-11-26 DIAGNOSIS — R000 Tachycardia, unspecified: Secondary | ICD-10-CM | POA: Insufficient documentation

## 2013-11-26 NOTE — Assessment & Plan Note (Signed)
As detailed above, abnormal by both ECG and echocardiographic criteria. Possible RCA or circumflex distribution ischemia with inferolateral wall hypokinesis. In light of her shortness of breath and also fluctuating blood pressures on medical therapy, plan is to further evaluate coronary anatomy for revascularization options via cardiac catheterization as discussed above. This is being scheduled with Dr. Angelena Form for next Wednesday, August 26.

## 2013-11-26 NOTE — Telephone Encounter (Signed)
Patient said she can come today and is in route now to the [place] office.

## 2013-11-26 NOTE — Telephone Encounter (Signed)
Patient informed. Patient can't come in the morning and is requesting to come 1 day next week due to her work schedule.

## 2013-11-26 NOTE — Assessment & Plan Note (Signed)
Blood pressure control looks good today. 

## 2013-11-26 NOTE — Telephone Encounter (Signed)
What about this afternoon in [place]? If that doesn't work then next Monday or Tuesday afternoon in [place].

## 2013-11-26 NOTE — Progress Notes (Signed)
Clinical Summary [name] is a 68 y.o.female just recently seen in consultation on August 11 with dyspnea on exertion in the setting of hypertension and family history of premature CAD in her father. She was referred for ischemic testing.  Exercise echocardiogram performed earlier today showed abnormal ST segment changes consistent with ischemia (stage 1) with high risk Duke treadmill score of -13, typical chest pain, and inferolateral hypokinesis at stress imaging. I discussed the results and implications.  We have discussed the potential risks and anticipated benefits of a diagnostic cardiac catheterization to better define her coronary anatomy and determine if any revascularization options need to be considered. She is in agreement to proceed, and requested that we schedule the procedure for next week when her daughter is off work.   Allergies  Allergen Reactions  . Codeine     Makes "jittery"    Current Outpatient Prescriptions  Medication Sig Dispense Refill  . calcium-vitamin D (OSCAL WITH D) 500-200 MG-UNIT per tablet Take 1 tablet by mouth daily.      Marland Kitchen lisinopril-hydrochlorothiazide (PRINZIDE,ZESTORETIC) 10-12.5 MG per tablet Take 1 tablet by mouth daily.       No current facility-administered medications for this visit.    Past Medical History  Diagnosis Date  . Essential hypertension, benign   . Bladder cancer   . Vertigo   . Osteoporosis     Past Surgical History  Procedure Laterality Date  . Abdominal hysterectomy    . Transurethral resection of bladder  1999?    Dr. [place] Pink, [facility]  . Retinal detachment surgery Right   . Cataract extraction w/phaco Left 10/30/2012    Procedure: CATARACT EXTRACTION PHACO AND INTRAOCULAR LENS PLACEMENT (IOC);  Surgeon: Elta Guadeloupe T. Gershon Crane, MD;  Location: [facility];  Service: Ophthalmology;  Laterality: Left;  CDE:6.00    Family History  Problem Relation Age of Onset  . Heart attack Father     Age 26  . Dementia Mother      Social History [name] reports that she quit smoking about 10 years ago. She does not have any smokeless tobacco history on file. [name] reports that she does not drink alcohol.  Review of Systems No palpitations or syncope. Stable appetite. No orthopnea or PND. No leg edema. No claudication. No melena or hematochezia. Other systems reviewed and negative.  Physical Examination Filed Vitals:   11/26/13 1348  BP: 118/62  Pulse: 60   Filed Weights   11/26/13 1348  Weight: 115 lb (52.164 kg)    Normally nourished appearing woman, comfortable at rest.  HEENT: Conjunctiva and lids normal, oropharynx clear.  Neck: Supple, no elevated JVP or carotid bruits, no thyromegaly.  Lungs: Clear to auscultation, nonlabored breathing at rest.  Cardiac: Regular rate and rhythm, no S3, soft apical systolic murmur, no pericardial rub.  Abdomen: Soft, nontender, bowel sounds present, no guarding or rebound.  Extremities: No pitting edema, distal pulses 2+.  Skin: Warm and dry.  Musculoskeletal: No kyphosis.  Neuropsychiatric: Alert and oriented x3, affect grossly appropriate.   Problem List and Plan   Abnormal stress echocardiogram As detailed above, abnormal by both ECG and echocardiographic criteria. Possible RCA or circumflex distribution ischemia with inferolateral wall hypokinesis. In light of her shortness of breath and also fluctuating blood pressures on medical therapy, plan is to further evaluate coronary anatomy for revascularization options via cardiac catheterization as discussed above. This is being scheduled with Dr. Angelena Form for next Wednesday, August 26.  Essential hypertension, benign Blood pressure  control looks good today.    Satira Sark, M.D., F.A.C.C.

## 2013-11-26 NOTE — Patient Instructions (Addendum)
Your physician recommends that you schedule a follow-up appointment in: to be determined after your heart cath   Please get a chest x-ray today   Your physician has requested that you have a cardiac catheterization. Cardiac catheterization is used to diagnose and/or treat various heart conditions. Doctors may recommend this procedure for a number of different reasons. The most common reason is to evaluate chest pain. Chest pain can be a symptom of coronary artery disease (CAD), and cardiac catheterization can show whether plaque is narrowing or blocking your heart's arteries. This procedure is also used to evaluate the valves, as well as measure the blood flow and oxygen levels in different parts of your heart. For further information please visit [email or web address]. Please follow instruction sheet, as given. Please take a Aspirin 325 mg the morning of your cath  Your physician recommends that you continue on your current medications as directed. Please refer to the Current Medication list given to you today.   Thank you for choosing [facility] !

## 2013-11-26 NOTE — Progress Notes (Signed)
  Echocardiogram Echocardiogram Stress Test has been performed.  [name], [place] 11/26/2013, 10:32 AM

## 2013-11-26 NOTE — Telephone Encounter (Signed)
Message copied by Merlene Laughter on Tue Nov 26, 2013 12:48 PM ------      Message from: Satira Sark      Created: Tue Nov 26, 2013 12:30 PM       Please let her know that the stress test was abnormal and indicates the possibility of a coronary blockage. Would recommend that we discuss a heart catheterization as a next step to further clarify. I can see her tomorrow AM early in [place] office if she would like. ------

## 2013-11-27 ENCOUNTER — Encounter (HOSPITAL_COMMUNITY): Payer: Self-pay | Admitting: Pharmacy Technician

## 2013-12-04 ENCOUNTER — Encounter (HOSPITAL_COMMUNITY)
Admission: RE | Disposition: A | Discharge status: Home / Self Care | Payer: Self-pay | Source: Ambulatory Visit | Attending: Cardiovascular Disease

## 2013-12-04 ENCOUNTER — Ambulatory Visit (HOSPITAL_COMMUNITY)
Admission: RE | Admit: 2013-12-04 | Discharge: 2013-12-04 | Disposition: A | Discharge status: Home / Self Care | Payer: Medicare Other | Source: Ambulatory Visit | Attending: Cardiovascular Disease | Admitting: Cardiovascular Disease

## 2013-12-04 DIAGNOSIS — I2510 Atherosclerotic heart disease of native coronary artery without angina pectoris: Secondary | ICD-10-CM | POA: Diagnosis not present

## 2013-12-04 DIAGNOSIS — M810 Age-related osteoporosis without current pathological fracture: Secondary | ICD-10-CM | POA: Diagnosis not present

## 2013-12-04 DIAGNOSIS — I2584 Coronary atherosclerosis due to calcified coronary lesion: Secondary | ICD-10-CM | POA: Diagnosis not present

## 2013-12-04 DIAGNOSIS — Z87891 Personal history of nicotine dependence: Secondary | ICD-10-CM | POA: Diagnosis not present

## 2013-12-04 DIAGNOSIS — Z8249 Family history of ischemic heart disease and other diseases of the circulatory system: Secondary | ICD-10-CM | POA: Diagnosis not present

## 2013-12-04 DIAGNOSIS — R0609 Other forms of dyspnea: Secondary | ICD-10-CM | POA: Diagnosis present

## 2013-12-04 DIAGNOSIS — Z8551 Personal history of malignant neoplasm of bladder: Secondary | ICD-10-CM | POA: Diagnosis not present

## 2013-12-04 DIAGNOSIS — R9439 Abnormal result of other cardiovascular function study: Secondary | ICD-10-CM

## 2013-12-04 DIAGNOSIS — I10 Essential (primary) hypertension: Secondary | ICD-10-CM | POA: Diagnosis not present

## 2013-12-04 HISTORY — PX: LEFT HEART CATHETERIZATION WITH CORONARY ANGIOGRAM: SHX5451

## 2013-12-04 LAB — BASIC METABOLIC PANEL
Anion gap: 13 (ref 5–15)
BUN: 12 mg/dL (ref 6–23)
CALCIUM: 10.2 mg/dL (ref 8.4–10.5)
CO2: 27 mEq/L (ref 19–32)
Chloride: 104 mEq/L (ref 96–112)
Creatinine, Ser: 0.73 mg/dL (ref 0.50–1.10)
GFR, EST NON AFRICAN AMERICAN: 86 mL/min — AB (ref >90)
GLUCOSE: 101 mg/dL — AB (ref 70–99)
Potassium: 4.9 mEq/L (ref 3.7–5.3)
SODIUM: 144 meq/L (ref 137–147)

## 2013-12-04 LAB — CBC
HCT: 40.9 % (ref 36.0–46.0)
Hemoglobin: 13.7 g/dL (ref 12.0–15.0)
MCH: 31.4 pg (ref 26.0–34.0)
MCHC: 33.5 g/dL (ref 30.0–36.0)
MCV: 93.8 fL (ref 78.0–100.0)
PLATELETS: 302 10*3/uL (ref 150–400)
RBC: 4.36 MIL/uL (ref 3.87–5.11)
RDW: 12.6 % (ref 11.5–15.5)
WBC: 8.7 10*3/uL (ref 4.0–10.5)

## 2013-12-04 LAB — PROTIME-INR
INR: 1.1 (ref 0.00–1.49)
Prothrombin Time: 14.2 seconds (ref 11.6–15.2)

## 2013-12-04 SURGERY — LEFT HEART CATHETERIZATION WITH CORONARY ANGIOGRAM
Anesthesia: LOCAL

## 2013-12-04 MED ORDER — SODIUM CHLORIDE 0.9 % IJ SOLN: 3.0000 mL | INTRAMUSCULAR | Status: DC | PRN

## 2013-12-04 MED ORDER — SODIUM CHLORIDE 0.9 % IJ SOLN: 3.0000 mL | Freq: Two times a day (BID) | INTRAMUSCULAR | Status: DC

## 2013-12-04 MED ORDER — ASPIRIN 81 MG PO CHEW
81.0000 mg | CHEWABLE_TABLET | ORAL | Status: DC
Start: 2013-12-04 — End: 2013-12-04

## 2013-12-04 MED ORDER — HEPARIN SODIUM (PORCINE) 1000 UNIT/ML IJ SOLN
INTRAMUSCULAR | Status: AC
  Filled 2013-12-04: qty 1

## 2013-12-04 MED ORDER — MIDAZOLAM HCL 2 MG/2ML IJ SOLN
INTRAMUSCULAR | Status: AC
  Filled 2013-12-04: qty 2

## 2013-12-04 MED ORDER — NITROGLYCERIN 1 MG/10 ML FOR IR/CATH LAB
INTRA_ARTERIAL | Status: AC
  Filled 2013-12-04: qty 10

## 2013-12-04 MED ORDER — VERAPAMIL HCL 2.5 MG/ML IV SOLN
INTRAVENOUS | Status: AC
  Filled 2013-12-04: qty 2

## 2013-12-04 MED ORDER — SODIUM CHLORIDE 0.9 % IV SOLN: 250.0000 mL | INTRAVENOUS | Status: DC | PRN

## 2013-12-04 MED ORDER — FENTANYL CITRATE 0.05 MG/ML IJ SOLN
INTRAMUSCULAR | Status: AC
  Filled 2013-12-04: qty 2

## 2013-12-04 MED ORDER — SODIUM CHLORIDE 0.9 % IV SOLN: INTRAVENOUS | Status: AC

## 2013-12-04 MED ORDER — SODIUM CHLORIDE 0.9 % IV SOLN
INTRAVENOUS | Status: DC
  Administered 2013-12-04: 09:00:00 via INTRAVENOUS

## 2013-12-04 MED ORDER — DIAZEPAM 5 MG PO TABS
ORAL_TABLET | ORAL | Status: AC
  Administered 2013-12-04: 5 mg via ORAL
  Filled 2013-12-04: qty 1

## 2013-12-04 MED ORDER — LIDOCAINE HCL (PF) 1 % IJ SOLN
INTRAMUSCULAR | Status: AC
  Filled 2013-12-04: qty 30

## 2013-12-04 MED ORDER — DIAZEPAM 5 MG PO TABS
5.0000 mg | ORAL_TABLET | Freq: Once | ORAL | Status: AC
  Administered 2013-12-04: 5 mg via ORAL

## 2013-12-04 MED ORDER — HEPARIN (PORCINE) IN NACL 2-0.9 UNIT/ML-% IJ SOLN
INTRAMUSCULAR | Status: AC
  Filled 2013-12-04: qty 1000

## 2013-12-04 MED ORDER — ASPIRIN 81 MG PO CHEW
CHEWABLE_TABLET | ORAL | Status: AC
  Filled 2013-12-04: qty 1

## 2013-12-04 NOTE — H&P (View-Only) (Signed)
Clinical Summary Ms. Boys is a 68 y.o.female just recently seen in consultation on August 11 with dyspnea on exertion in the setting of hypertension and family history of premature CAD in her father. She was referred for ischemic testing.  Exercise echocardiogram performed earlier today showed abnormal ST segment changes consistent with ischemia (stage 1) with high risk Duke treadmill score of -13, typical chest pain, and inferolateral hypokinesis at stress imaging. I discussed the results and implications.  We have discussed the potential risks and anticipated benefits of a diagnostic cardiac catheterization to better define her coronary anatomy and determine if any revascularization options need to be considered. She is in agreement to proceed, and requested that we schedule the procedure for next week when her daughter is off work.   Allergies  Allergen Reactions  . Codeine     Makes "jittery"    Current Outpatient Prescriptions  Medication Sig Dispense Refill  . calcium-vitamin D (OSCAL WITH D) 500-200 MG-UNIT per tablet Take 1 tablet by mouth daily.      Marland Kitchen lisinopril-hydrochlorothiazide (PRINZIDE,ZESTORETIC) 10-12.5 MG per tablet Take 1 tablet by mouth daily.       No current facility-administered medications for this visit.    Past Medical History  Diagnosis Date  . Essential hypertension, benign   . Bladder cancer   . Vertigo   . Osteoporosis     Past Surgical History  Procedure Laterality Date  . Abdominal hysterectomy    . Transurethral resection of bladder  1999?    Dr. Maryland Pink, [facility]  . Retinal detachment surgery Right   . Cataract extraction w/phaco Left 10/30/2012    Procedure: CATARACT EXTRACTION PHACO AND INTRAOCULAR LENS PLACEMENT (IOC);  Surgeon: Elta Guadeloupe T. Gershon Crane, MD;  Location: [facility];  Service: Ophthalmology;  Laterality: Left;  CDE:6.00    Family History  Problem Relation Age of Onset  . Heart attack Father     Age 49  . Dementia Mother      Social History Ms. Freund reports that she quit smoking about 10 years ago. She does not have any smokeless tobacco history on file. Ms. Gouin reports that she does not drink alcohol.  Review of Systems No palpitations or syncope. Stable appetite. No orthopnea or PND. No leg edema. No claudication. No melena or hematochezia. Other systems reviewed and negative.  Physical Examination Filed Vitals:   11/26/13 1348  BP: 118/62  Pulse: 60   Filed Weights   11/26/13 1348  Weight: 115 lb (52.164 kg)    Normally nourished appearing woman, comfortable at rest.  HEENT: Conjunctiva and lids normal, oropharynx clear.  Neck: Supple, no elevated JVP or carotid bruits, no thyromegaly.  Lungs: Clear to auscultation, nonlabored breathing at rest.  Cardiac: Regular rate and rhythm, no S3, soft apical systolic murmur, no pericardial rub.  Abdomen: Soft, nontender, bowel sounds present, no guarding or rebound.  Extremities: No pitting edema, distal pulses 2+.  Skin: Warm and dry.  Musculoskeletal: No kyphosis.  Neuropsychiatric: Alert and oriented x3, affect grossly appropriate.   Problem List and Plan   Abnormal stress echocardiogram As detailed above, abnormal by both ECG and echocardiographic criteria. Possible RCA or circumflex distribution ischemia with inferolateral wall hypokinesis. In light of her shortness of breath and also fluctuating blood pressures on medical therapy, plan is to further evaluate coronary anatomy for revascularization options via cardiac catheterization as discussed above. This is being scheduled with Dr. Angelena Form for next Wednesday, August 26.  Essential hypertension, benign Blood pressure  control looks good today.    Satira Sark, M.D., F.A.C.C.

## 2013-12-04 NOTE — Interval H&P Note (Signed)
History and Physical Interval Note:  12/04/2013 9:18 AM  [name]  has presented today for cardiac cath with the diagnosis of abnormal stress echo, chest pain.  The various methods of treatment have been discussed with the patient and family. After consideration of risks, benefits and other options for treatment, the patient has consented to  Procedure(s): LEFT HEART CATHETERIZATION WITH CORONARY ANGIOGRAM (N/A) as a surgical intervention .  The patient's history has been reviewed, patient examined, no change in status, stable for surgery.  I have reviewed the patient's chart and labs.  Questions were answered to the patient's satisfaction.    Cath Lab Visit (complete for each Cath Lab visit)  Clinical Evaluation Leading to the Procedure:   ACS: No.  Non-ACS:    Anginal Classification: CCS III  Anti-ischemic medical therapy: No Therapy  Non-Invasive Test Results: Intermediate-risk stress test findings: cardiac mortality 1-3%/year  Prior CABG: No previous CABG        Aldwin Micalizzi

## 2013-12-04 NOTE — CV Procedure (Signed)
      Cardiac Catheterization Operative Report  [name] [id number] 8/26/201510:06 AM Glenda Chroman., MD  Procedure Performed:  1. Left Heart Catheterization 2. Selective Coronary Angiography 3. Left ventricular angiogram  Operator: Lauree Chandler, MD  Arterial access site:  Right radial artery.   Indication:  68 yo female with history of HTN with recent dyspnea on exertion concerning for unstable angina. Stress echo intermediate risk with inferolateral wall motion abnormality c/w ischemia, chest pain with exercise and EKG changes.                                       Procedure Details: The risks, benefits, complications, treatment options, and expected outcomes were discussed with the patient. The patient and/or family concurred with the proposed plan, giving informed consent. The patient was brought to the cath lab after IV hydration was begun and oral premedication was given. The patient was further sedated with Versed and Fentanyl. The right wrist was assessed with a reverse Allens test which was positive. The right wrist was prepped and draped in a sterile fashion. 1% lidocaine was used for local anesthesia. Using the modified Seldinger access technique, a 5 French sheath was placed in the right radial artery. 3 mg Verapamil was given through the sheath. 3500 units IV heparin was given. Standard diagnostic catheters were used to perform selective coronary angiography. A pigtail catheter was used to perform a left ventricular angiogram. The sheath was removed from the right radial artery and a Terumo hemostasis band was applied at the arteriotomy site on the right wrist.    There were no immediate complications. The patient was taken to the recovery area in stable condition.   Hemodynamic Findings: Central aortic pressure: 164/68 Left ventricular pressure: 155/6/11  Angiographic Findings:  Left main: Long smooth vessel that bifurcates into the LAD and the Circumflex.  The proximal segment just beyond the ostium has an eccentric 30% stenosis. The remainder of the vessel has no plaque disease.   Left Anterior Descending Artery: Large caliber vessel that courses to the apex. The proximal vessel has calcification with diffuse 20% stenosis. The mid and distal vessel is free of obstructive disease. The moderate caliber diagonal branch has no obstructive disease.   Circumflex Artery: Large caliber vessel with termination into a large caliber first obtuse marginal branch. The ostium of the Circumflex has an eccentric 30% calcified stenosis. The obtuse marginal branch is free of obstructive disease.    Right Coronary Artery: Large caliber dominant vessel with 30% proximal stenosis, 20% mid stenosis.   Left Ventricular Angiogram: LVEF=65%.   Impression: 1. Mild non-obstructive CAD 2. Normal LV systolic function  Recommendations: Would pursue medical management of CAD with aggressive risk factor reduction.        Complications:  None. The patient tolerated the procedure well.

## 2013-12-04 NOTE — Discharge Instructions (Signed)
Radial Site Care °Refer to this sheet in the next few weeks. These instructions provide you with information on caring for yourself after your procedure. Your caregiver may also give you more specific instructions. Your treatment has been planned according to current medical practices, but problems sometimes occur. Call your caregiver if you have any problems or questions after your procedure. °HOME CARE INSTRUCTIONS °· You may shower the day after the procedure. Remove the bandage (dressing) and gently wash the site with plain soap and water. Gently pat the site dry. °· Do not apply powder or lotion to the site. °· Do not submerge the affected site in water for 3 to 5 days. °· Inspect the site at least twice daily. °· Do not flex or bend the affected arm for 24 hours. °· No lifting over 5 pounds (2.3 kg) for 5 days after your procedure. °· Do not drive home if you are discharged the same day of the procedure. Have someone else drive you. °· You may drive 24 hours after the procedure unless otherwise instructed by your caregiver. °· Do not operate machinery or power tools for 24 hours. °· A responsible adult should be with you for the first 24 hours after you arrive home. °What to expect: °· Any bruising will usually fade within 1 to 2 weeks. °· Blood that collects in the tissue (hematoma) may be painful to the touch. It should usually decrease in size and tenderness within 1 to 2 weeks. °SEEK IMMEDIATE MEDICAL CARE IF: °· You have unusual pain at the radial site. °· You have redness, warmth, swelling, or pain at the radial site. °· You have drainage (other than a small amount of blood on the dressing). °· You have chills. °· You have a fever or persistent symptoms for more than 72 hours. °· You have a fever and your symptoms suddenly get worse. °· Your arm becomes pale, cool, tingly, or numb. °· You have heavy bleeding from the site. Hold pressure on the site. °Document Released: 04/30/2010 Document Revised:  06/20/2011 Document Reviewed: 04/30/2010 °ExitCare® Patient Information ©2015 [employer]. This information is not intended to replace advice given to you by your health care provider. Make sure you discuss any questions you have with your health care provider. ° °

## 2013-12-30 ENCOUNTER — Encounter: Payer: Self-pay | Admitting: Cardiology

## 2013-12-30 ENCOUNTER — Ambulatory Visit (INDEPENDENT_AMBULATORY_CARE_PROVIDER_SITE_OTHER): Payer: Medicare Other | Admitting: Cardiology

## 2013-12-30 VITALS — BP 160/74 | HR 51 | Ht 62.0 in | Wt 115.8 lb

## 2013-12-30 DIAGNOSIS — R9439 Abnormal result of other cardiovascular function study: Secondary | ICD-10-CM

## 2013-12-30 NOTE — Patient Instructions (Signed)
Continue all current medications. Your physician wants you to follow up in:  1 year.  You will receive a reminder letter in the mail one-two months in advance.  If you don't receive a letter, please call our office to schedule the follow up appointment   

## 2013-12-30 NOTE — Assessment & Plan Note (Signed)
Fortunately, patient documented to have only mild coronary atherosclerosis at heart catheterization. Would recommend healthy diet and exercise, she is already motivated in both aspects. Continue aspirin daily, and followup lipids with Dr. Woody Seller. Could always consider a statin, particularly if LDL not already under 100. Will see her back in one year.

## 2013-12-30 NOTE — Progress Notes (Signed)
    Clinical Summary [name] is a 68 y.o.female last seen in August, and referred for a diagnostic heart catheterization following an abnormal exercise echocardiogram as detailed in the previous note. Fortunately, cardiac catheterization revealed only mild, nonobstructive coronary atherosclerosis.  She comes in today for followup. States that she has been doing well, has a good outlook on things at this point. She has joined a dancing class in fact to try and increase her activity. We reviewed the results of the heart catheterization and discussed healthy lifestyle considerations. She already follows a fairly reasonable diet. I asked her to keep an eye on her cholesterol with [name] and also stay on an aspirin a day for now.   Allergies  Allergen Reactions  . Codeine Other (See Comments)    Makes "jittery"    Current Outpatient Prescriptions  Medication Sig Dispense Refill  . aspirin 81 MG tablet Take 81 mg by mouth daily.      . Ca Phosphate-Cholecalciferol (CALCIUM/VITAMIN D3 GUMMIES PO) Take 2 each by mouth 2 (two) times daily.      Marland Kitchen lisinopril-hydrochlorothiazide (PRINZIDE,ZESTORETIC) 10-12.5 MG per tablet Take 0.5 tablets by mouth every other day.        No current facility-administered medications for this visit.    Past Medical History  Diagnosis Date  . Essential hypertension, benign   . Bladder cancer   . Vertigo   . Osteoporosis   . Coronary atherosclerosis     Mild by heart catheterization August 2015    Social History [name] reports that she quit smoking about 10 years ago. She does not have any smokeless tobacco history on file. [name] reports that she does not drink alcohol.  Review of Systems Other systems reviewed and negative.  Physical Examination Filed Vitals:   12/30/13 1601  BP: 160/74  Pulse: 51   Filed Weights   12/30/13 1601  Weight: 115 lb 12.8 oz (52.527 kg)    Normally nourished appearing woman, comfortable at rest.  HEENT:  Conjunctiva and lids normal, oropharynx clear.  Neck: Supple, no elevated JVP or carotid bruits, no thyromegaly.  Lungs: Clear to auscultation, nonlabored breathing at rest.  Cardiac: Regular rate and rhythm, no S3, soft apical systolic murmur, no pericardial rub.  Abdomen: Soft, nontender, bowel sounds present, no guarding or rebound.  Extremities: No pitting edema, distal pulses 2+.    Problem List and Plan   Abnormal stress echocardiogram Fortunately, patient documented to have only mild coronary atherosclerosis at heart catheterization. Would recommend healthy diet and exercise, she is already motivated in both aspects. Continue aspirin daily, and followup lipids with [name]. Could always consider a statin, particularly if LDL not already under 100. Will see her back in one year.    Satira Sark, M.D., F.A.C.C.

## 2014-03-20 ENCOUNTER — Encounter (HOSPITAL_COMMUNITY): Payer: Self-pay | Admitting: Cardiovascular Disease

## 2014-12-31 ENCOUNTER — Ambulatory Visit (INDEPENDENT_AMBULATORY_CARE_PROVIDER_SITE_OTHER): Payer: Medicare Other | Admitting: Cardiology

## 2014-12-31 ENCOUNTER — Encounter: Payer: Self-pay | Admitting: Cardiology

## 2014-12-31 VITALS — BP 134/77 | HR 47 | Ht 62.0 in | Wt 118.0 lb

## 2014-12-31 DIAGNOSIS — I10 Essential (primary) hypertension: Secondary | ICD-10-CM | POA: Diagnosis not present

## 2014-12-31 DIAGNOSIS — I2510 Atherosclerotic heart disease of native coronary artery without angina pectoris: Secondary | ICD-10-CM

## 2014-12-31 NOTE — Progress Notes (Signed)
Cardiology Office Note  Date: 12/31/2014   ID: [name], DOB [birth year], MRN [id number]  PCP: Glenda Chroman., MD  Primary Cardiologist: Rozann Lesches, MD   Chief Complaint  Patient presents with  . Coronary atherosclerosis    History of Present Illness: [name] is a 69 y.o. female last seen in September 2015. She has a history of mild coronary atherosclerosis documented at cardiac catheterization in August 2015. She continues to do very well, no angina symptoms or significant shortness of breath beyond NYHA class I. She works part-time in Geologist, engineering at [employer]. Also continues to dance regularly for exercise.  ECG today shows sinus bradycardia with low voltage, decreased R wave progression. She reports no exertional limitations, dizziness, or syncope.  She states that she had a follow-up echocardiogram with Dr. Woody Seller earlier in the year, we will request the results for review.   Past Medical History  Diagnosis Date  . Essential hypertension, benign   . Bladder cancer   . Vertigo   . Osteoporosis   . Coronary atherosclerosis     Mild by heart catheterization August 2015    Current Outpatient Prescriptions  Medication Sig Dispense Refill  . aspirin 81 MG tablet Take 81 mg by mouth daily.    . Ca Phosphate-Cholecalciferol (CALCIUM/VITAMIN D3 GUMMIES PO) Take 2 each by mouth 2 (two) times daily.    . valsartan-hydrochlorothiazide (DIOVAN-HCT) 80-12.5 MG per tablet Take 1 tablet by mouth daily.     No current facility-administered medications for this visit.    Allergies:  Lisinopril and Codeine   Social History: The patient  reports that she quit smoking about 11 years ago. Her smoking use included Cigarettes. She has never used smokeless tobacco. She reports that she does not drink alcohol or use illicit drugs.   ROS:  Please see the history of present illness. Otherwise, complete review of systems is positive for none.  All other systems are reviewed  and negative.   Physical Exam: VS:  BP 134/77 mmHg  Pulse 47  Ht 5\' 2"  (1.575 m)  Wt 118 lb (53.524 kg)  BMI 21.58 kg/m2  SpO2 98%, BMI Body mass index is 21.58 kg/(m^2).  Wt Readings from Last 3 Encounters:  12/31/14 118 lb (53.524 kg)  12/30/13 115 lb 12.8 oz (52.527 kg)  12/04/13 115 lb (52.164 kg)     Normally nourished appearing woman, comfortable at rest.  HEENT: Conjunctiva and lids normal, oropharynx clear.  Neck: Supple, no elevated JVP or carotid bruits, no thyromegaly.  Lungs: Clear to auscultation, nonlabored breathing at rest.  Cardiac: Regular rate and rhythm, no S3, soft apical systolic murmur, no pericardial rub.  Abdomen: Soft, nontender, bowel sounds present, no guarding or rebound.  Extremities: No pitting edema, distal pulses 2+.    ECG: ECG is ordered today.   Assessment and Plan:  1. Asymptomatic, mild coronary atherosclerosis as outlined above. Continue regular exercise. She is on aspirin and ARB. Keep regular follow-up with Dr. Woody Seller for risk factor modification, due for follow-up lipids later this year. Could consider statin for more aggressive management as indicated. We will request the echocardiogram report for review as well.  2. Essential hypertension, now on Diovan HCT, she had cough on ACE inhibitor.  Current medicines were reviewed with the patient today.   Orders Placed This Encounter  Procedures  . EKG 12-Lead    Disposition: FU with me in 1 year.   Signed, Satira Sark, MD, [facility] 12/31/2014 1:35  PM    [facility] at [address], [place], [place] [address] Phone: [phone]; Fax: [phone]

## 2014-12-31 NOTE — Patient Instructions (Signed)

## 2015-12-31 ENCOUNTER — Encounter: Payer: Self-pay | Admitting: *Deleted

## 2016-01-01 ENCOUNTER — Ambulatory Visit (INDEPENDENT_AMBULATORY_CARE_PROVIDER_SITE_OTHER): Payer: Medicare Other | Admitting: Cardiology

## 2016-01-01 ENCOUNTER — Encounter: Payer: Self-pay | Admitting: Cardiology

## 2016-01-01 VITALS — BP 128/70 | HR 41 | Ht 62.0 in | Wt 119.0 lb

## 2016-01-01 DIAGNOSIS — R001 Bradycardia, unspecified: Secondary | ICD-10-CM | POA: Diagnosis not present

## 2016-01-01 DIAGNOSIS — Z889 Allergy status to unspecified drugs, medicaments and biological substances status: Secondary | ICD-10-CM | POA: Diagnosis not present

## 2016-01-01 DIAGNOSIS — Z789 Other specified health status: Secondary | ICD-10-CM

## 2016-01-01 DIAGNOSIS — I2510 Atherosclerotic heart disease of native coronary artery without angina pectoris: Secondary | ICD-10-CM | POA: Diagnosis not present

## 2016-01-01 DIAGNOSIS — I959 Hypotension, unspecified: Secondary | ICD-10-CM | POA: Diagnosis not present

## 2016-01-01 NOTE — Patient Instructions (Addendum)
Your physician wants you to follow-up in: [name] will receive a reminder letter in the mail two months in advance. If you don't receive a letter, please call our office to schedule the follow-up appointment.  Your physician has recommended you make the following change in your medication:   STOP DIOVAN   MONITOR YOUR BLOOD PRESSURE AND BRING TO YOUR NEXT APPOINTMENT WITH DR. VYAS.  Thank you for choosing [facility]!!

## 2016-01-01 NOTE — Progress Notes (Signed)
Cardiology Office Note  Date: 01/01/2016   ID: [name], DOB Sep 24, 1945, MRN [id number]  PCP: Heidi Chroman, MD  Primary Cardiologist: Heidi Lesches, MD   Chief Complaint  Patient presents with  . Coronary atherosclerosis    History of Present Illness: [name] is a 71 y.o. female last seen in September 2016. She presents for a routine follow-up visit. Does not report any significant chest pain with exertion or unusual shortness of breath. She still has been dancing for exercise, works part time at the [employer] and daily as before. Enjoys staying busy.  I reviewed her ECG today which shows a sinus bradycardia in the 40s with sinus arrhythmia and leftward axis. This has been chronic, she has no dizziness or syncope.  She does tell me that she has had low blood pressure associated with lightheadedness and has been intermittently holding her Diovan HCT. She has a blood pressure cuff that she checks regularly at home.  She states that she has a follow-up visit with Dr. Woody Ritter in the near future for physical and lab work.  Past Medical History:  Diagnosis Date  . Bladder cancer ([place])   . Coronary atherosclerosis    Mild by heart catheterization August 2015  . Essential hypertension, benign   . Osteoporosis   . Vertigo     Past Surgical History:  Procedure Laterality Date  . ABDOMINAL HYSTERECTOMY    . CATARACT EXTRACTION W/PHACO Left 10/30/2012   Procedure: CATARACT EXTRACTION PHACO AND INTRAOCULAR LENS PLACEMENT (IOC);  Surgeon: Elta Guadeloupe T. Gershon Crane, MD;  Location: [facility];  Service: Ophthalmology;  Laterality: Left;  CDE:6.00  . LEFT HEART CATHETERIZATION WITH CORONARY ANGIOGRAM N/A 12/04/2013   Procedure: LEFT HEART CATHETERIZATION WITH CORONARY ANGIOGRAM;  Surgeon: Burnell Blanks, MD;  [facility];  Service: Cardiovascular;  Laterality: N/A;  . RETINAL DETACHMENT SURGERY Right   . TRANSURETHRAL RESECTION OF BLADDER  1999?   Dr. Maryland Pink, [facility]     Current Outpatient Prescriptions  Medication Sig Dispense Refill  . aspirin 81 MG tablet Take 81 mg by mouth daily.    . Ca Phosphate-Cholecalciferol (CALCIUM/VITAMIN D3 GUMMIES PO) Take 2 each by mouth 2 (two) times daily.    . Red Yeast Rice Extract (RED YEAST RICE PO) Take by mouth daily.     No current facility-administered medications for this visit.    Allergies:  Lisinopril and Codeine   Social History: The patient  reports that she quit smoking about 12 years ago. Her smoking use included Cigarettes. She has never used smokeless tobacco. She reports that she does not drink alcohol or use drugs.   ROS:  Please see the history of present illness. Otherwise, complete review of systems is positive for none.  All other systems are reviewed and negative.   Physical Exam: VS:  BP 128/70   Pulse (!) 41   Ht 5\' 2"  (1.575 m)   Wt 119 lb (54 kg)   SpO2 98%   BMI 21.77 kg/m , BMI Body mass index is 21.77 kg/m.  Wt Readings from Last 3 Encounters:  01/01/16 119 lb (54 kg)  12/31/14 118 lb (53.5 kg)  12/30/13 115 lb 12.8 oz (52.5 kg)    Normally nourished appearing woman, comfortable at rest.  HEENT: Conjunctiva and lids normal, oropharynx clear.  Neck: Supple, no elevated JVP or carotid bruits, no thyromegaly.  Lungs: Clear to auscultation, nonlabored breathing at rest.  Cardiac: Regular rate and rhythm, no S3, soft  apical systolic murmur, no pericardial rub.  Abdomen: Soft, nontender, bowel sounds present, no guarding or rebound.  Extremities: No pitting edema, distal pulses 2+.  Skin: Warm and dry. [place] skeletal: No kyphosis. Neuropsychiatric: Alert and oriented 3, affect appropriate.  ECG: I personally reviewed the tracing from 12/31/2014 which showed sinus bradycardia with decreased R wave progression and low voltage.  Other Studies Reviewed Today:  Cardiac catheterization 12/04/2013: Hemodynamic Findings: Central aortic pressure: 164/68 Left ventricular  pressure: 155/6/11  Angiographic Findings:  Left main: Long smooth vessel that bifurcates into the LAD and the Circumflex. The proximal segment just beyond the ostium has an eccentric 30% stenosis. The remainder of the vessel has no plaque disease.   Left Anterior Descending Artery: Large caliber vessel that courses to the apex. The proximal vessel has calcification with diffuse 20% stenosis. The mid and distal vessel is free of obstructive disease. The moderate caliber diagonal branch has no obstructive disease.   Circumflex Artery: Large caliber vessel with termination into a large caliber first obtuse marginal branch. The ostium of the Circumflex has an eccentric 30% calcified stenosis. The obtuse marginal branch is free of obstructive disease.    Right Coronary Artery: Large caliber dominant vessel with 30% proximal stenosis, 20% mid stenosis.   Left Ventricular Angiogram: LVEF=65%.   Impression: 1. Mild non-obstructive CAD 2. Normal LV systolic function  Assessment and Plan:  1. History of mild nonobstructive CAD by prior workup as outlined above. She has no angina symptoms and reports activities far exceeding 4 METs. Recommend continued medical therapy and observation. She has had statin intolerance, takes aspirin and red yeast rice.  2. Intermittent hypotension. Recommend holding Diovan HCTZ, check blood pressure daily for the next one to 2 weeks. If trend goes back up, she may be able to cut the dose of Diovan HCTZ in half and continue, keep follow-up with Dr. Woody Ritter for review.  3. Asymptomatic sinus bradycardia. She is not on any heart rate lowering medications.  4. Health maintenance, has pending visit with Dr. Woody Ritter for lab work.  Current medicines were reviewed with the patient today.   Orders Placed This Encounter  Procedures  . EKG 12-Lead    Disposition: Follow-up with me in one year.  Signed, Satira Sark, MD, [facility] 01/01/2016 10:28 AM    [facility] [place], [place], [place] [address] Phone: [phone]; Fax: [phone]

## 2016-02-04 ENCOUNTER — Encounter: Payer: Self-pay | Admitting: Cardiology

## 2017-01-04 NOTE — Progress Notes (Signed)
Cardiology Office Note  Date: 01/05/2017   ID: [name], DOB [birth year], MRN [id number]  PCP: Glenda Chroman, MD  Primary Cardiologist: Rozann Lesches, MD   Chief Complaint  Patient presents with  . Coronary Artery Disease    History of Present Illness: [name] is a 71 y.o. female last seen in September 2017. She presents for a routine follow-up visit. Since last encounter she does not report any exertional chest pain, no unusual shortness of breath with typical activities. She has had no palpitations or syncope.  She continues to work at [employer]. PCP follow-up continues with [facility]. She does tell me that her triglycerides were elevated at most recent check, but she feels like this was mostly dietary. Her triglycerides were only 122 last year.  We went over her medications which are outlined below. She reports no bleeding problems with aspirin.  I personally reviewed her ECG today which shows sinus bradycardia with short PR interval and nonspecific ST changes.  Past Medical History:  Diagnosis Date  . Bladder cancer ([place])   . Coronary atherosclerosis    Mild by heart catheterization August 2015  . Essential hypertension, benign   . Osteoporosis   . Vertigo     Past Surgical History:  Procedure Laterality Date  . ABDOMINAL HYSTERECTOMY    . CATARACT EXTRACTION W/PHACO Left 10/30/2012   Procedure: CATARACT EXTRACTION PHACO AND INTRAOCULAR LENS PLACEMENT (IOC);  Surgeon: Elta Guadeloupe T. Gershon Crane, MD;  [facility]: [facility];  Service: Ophthalmology;  Laterality: Left;  CDE:6.00  . LEFT HEART CATHETERIZATION WITH CORONARY ANGIOGRAM N/A 12/04/2013   Procedure: LEFT HEART CATHETERIZATION WITH CORONARY ANGIOGRAM;  Surgeon: Burnell Blanks, MD;  [facility];  Service: Cardiovascular;  Laterality: N/A;  . RETINAL DETACHMENT SURGERY Right   . TRANSURETHRAL RESECTION OF BLADDER  1999?   Dr. Maryland Pink, [facility]    Current Outpatient Prescriptions    Medication Sig Dispense Refill  . aspirin 81 MG tablet Take 81 mg by mouth daily.    . Red Yeast Rice Extract (RED YEAST RICE PO) Take by mouth daily.     No current facility-administered medications for this visit.    Allergies:  Lisinopril and Codeine   Social History: The patient  reports that she quit smoking about 13 years ago. Her smoking use included Cigarettes. She has never used smokeless tobacco. She reports that she does not drink alcohol or use drugs.   ROS:  Please see the history of present illness. Otherwise, complete review of systems is positive for none.  All other systems are reviewed and negative.   Physical Exam: VS:  BP (!) 158/70   Pulse (!) 49   Ht 5\' 1"  (1.549 m)   Wt 120 lb (54.4 kg)   SpO2 98%   BMI 22.67 kg/m , BMI Body mass index is 22.67 kg/m.  Wt Readings from Last 3 Encounters:  01/05/17 120 lb (54.4 kg)  01/01/16 119 lb (54 kg)  12/31/14 118 lb (53.5 kg)    General: Patient appears comfortable at rest. HEENT: Conjunctiva and lids normal, oropharynx clear. Neck: Supple, no elevated JVP or carotid bruits, no thyromegaly. Lungs: Clear to auscultation, nonlabored breathing at rest. Cardiac: Regular rate and rhythm, no S3, soft systolic murmur, no pericardial rub. Abdomen: Soft, nontender, bowel sounds present, no guarding or rebound. Extremities: No pitting edema, distal pulses 2+. Skin: Warm and dry. Musculoskeletal: No kyphosis. Neuropsychiatric: Alert and oriented x3, affect grossly appropriate.  ECG:  I personally reviewed the tracing from 01/01/2016 which showed sinus bradycardia with leftward axis and low voltage.  Recent Labwork:  October 2017: Hemoglobin 13.6, platelets 323, BUN 12, creatinine 0.73, potassium 4.4, AST 19, ALT 13, cholesterol 188, triglycerides 122, HDL 66, LDL 98, TSH 2.0  Other Studies Reviewed Today:  Cardiac catheterization 12/04/2013: Hemodynamic Findings: Central aortic pressure: 164/68 Left ventricular  pressure: 155/6/11  Angiographic Findings:  Left main: Long smooth vessel that bifurcates into the LAD and the Circumflex. The proximal segment just beyond the ostium has an eccentric 30% stenosis. The remainder of the vessel has no plaque disease.   Left Anterior Descending Artery: Large caliber vessel that courses to the apex. The proximal vessel has calcification with diffuse 20% stenosis. The mid and distal vessel is free of obstructive disease. The moderate caliber diagonal branch has no obstructive disease.   Circumflex Artery: Large caliber vessel with termination into a large caliber first obtuse marginal branch. The ostium of the Circumflex has an eccentric 30% calcified stenosis. The obtuse marginal branch is free of obstructive disease.   Right Coronary Artery: Large caliber dominant vessel with 30% proximal stenosis, 20% mid stenosis.   Left Ventricular Angiogram: LVEF=65%.   Impression: 1. Mild non-obstructive CAD 2. Normal LV systolic function  Assessment and Plan:  1. Mild, nonobstructive CAD, no active angina symptoms this time. ECG reviewed and stable. She continues on aspirin with plan for observation.  2. Essential hypertension. She was previously on Diovan HCTZ, now discontinued by PCP due to intermittently documented low blood pressures. Would continue to follow blood pressure trend with [facility]. Blood pressure is mildly elevated today.  3. Sinus bradycardia, no history of dizziness, sudden palpitations, or syncope. She does have a short PR interval. Continue with observation.  4. Reported elevated triglycerides based on recent check per PCP. She is modifying her diet and has follow-up lab work pending. Last LDL was 98. She has a history of statin intolerance.  Current medicines were reviewed with the patient today.   Orders Placed This Encounter  Procedures  . EKG 12-Lead    Disposition: Follow-up in one year.  Signed, Satira Sark, MD, [facility] 01/05/2017 11:56 AM    [facility] [place], [place], [place] [address] Phone: [phone]; Fax: [phone]

## 2017-01-05 ENCOUNTER — Ambulatory Visit (INDEPENDENT_AMBULATORY_CARE_PROVIDER_SITE_OTHER): Payer: Medicare Other | Admitting: Cardiology

## 2017-01-05 ENCOUNTER — Encounter: Payer: Self-pay | Admitting: Cardiology

## 2017-01-05 VITALS — BP 158/70 | HR 49 | Ht 61.0 in | Wt 120.0 lb

## 2017-01-05 DIAGNOSIS — I2510 Atherosclerotic heart disease of native coronary artery without angina pectoris: Secondary | ICD-10-CM

## 2017-01-05 DIAGNOSIS — R001 Bradycardia, unspecified: Secondary | ICD-10-CM

## 2017-01-05 DIAGNOSIS — I10 Essential (primary) hypertension: Secondary | ICD-10-CM | POA: Diagnosis not present

## 2017-01-05 DIAGNOSIS — Z789 Other specified health status: Secondary | ICD-10-CM | POA: Diagnosis not present

## 2017-01-05 NOTE — Patient Instructions (Signed)

## 2018-01-08 NOTE — Progress Notes (Signed)
Cardiology Office Note  Date: 01/09/2018   ID: [name], DOB October 28, 1945, MRN [id number]  PCP: Glenda Chroman, MD  Primary Cardiologist: Rozann Lesches, MD   Chief Complaint  Patient presents with  . Cardiac follow-up    History of Present Illness: ESSICA Ritter is a 72 y.o. female last seen in September 2018.  She presents for a routine follow-up visit.  Since last evaluation she does not report any major changes in health.  She enjoys walking for exercise, recently got back from a trip to the mountains.  She has had no exertional chest pain or breathlessness beyond NYHA class II.  No palpitations or syncope.  We discussed obtaining interval lab work from PCP.  She recalls being told that her triglycerides were elevated.  This has been an issue for her previously.   I reviewed her current medications which are outlined below.  She states that she continues to check blood pressure at home periodically, does not indicate systolics consistently over 150.  I personally reviewed her ECG today which shows sinus bradycardia with low voltage and nonspecific T wave changes.  Past Medical History:  Diagnosis Date  . Bladder cancer ([place])   . Coronary atherosclerosis    Mild by heart catheterization August 2015  . Essential hypertension, benign   . Osteoporosis   . Vertigo     Past Surgical History:  Procedure Laterality Date  . ABDOMINAL HYSTERECTOMY    . CATARACT EXTRACTION W/PHACO Left 10/30/2012   Procedure: CATARACT EXTRACTION PHACO AND INTRAOCULAR LENS PLACEMENT (IOC);  Surgeon: Elta Guadeloupe T. Gershon Crane, MD;  Location: [facility];  Service: Ophthalmology;  Laterality: Left;  CDE:6.00  . LEFT HEART CATHETERIZATION WITH CORONARY ANGIOGRAM N/A 12/04/2013   Procedure: LEFT HEART CATHETERIZATION WITH CORONARY ANGIOGRAM;  Surgeon: Burnell Blanks, MD;  Location: [facility] CATH LAB;  Service: Cardiovascular;  Laterality: N/A;  . RETINAL DETACHMENT SURGERY Right   . TRANSURETHRAL  RESECTION OF BLADDER  1999?   Dr. Maryland Pink, [facility]    Current Outpatient Medications  Medication Sig Dispense Refill  . aspirin 81 MG tablet Take 81 mg by mouth daily.    . Magnesium Oxide (MAG-OX PO) Take by mouth.    . Red Yeast Rice Extract (RED YEAST RICE PO) Take by mouth daily.    . vitamin C (ASCORBIC ACID) 500 MG tablet Take 500 mg by mouth daily.    . Zinc Sulfate (ZINC 15 PO) Take by mouth.     No current facility-administered medications for this visit.    Allergies:  Lisinopril and Codeine   Social History: The patient  reports that she quit smoking about 14 years ago. Her smoking use included cigarettes. She has never used smokeless tobacco. She reports that she does not drink alcohol or use drugs.   ROS:  Please see the history of present illness. Otherwise, complete review of systems is positive for none.  All other systems are reviewed and negative.   Physical Exam: VS:  BP (!) 148/64   Pulse (!) 47   Ht 5\' 2"  (1.575 m)   Wt 124 lb (56.2 kg)   SpO2 98%   BMI 22.68 kg/m , BMI Body mass index is 22.68 kg/m.  Wt Readings from Last 3 Encounters:  01/09/18 124 lb (56.2 kg)  01/05/17 120 lb (54.4 kg)  01/01/16 119 lb (54 kg)    General: Patient appears comfortable at rest. HEENT: Conjunctiva and lids normal, oropharynx clear. Neck: Supple, no elevated JVP or  carotid bruits, no thyromegaly. Lungs: Clear to auscultation, nonlabored breathing at rest. Cardiac: Regular rate and rhythm, no S3, soft systolic murmur. Abdomen: Soft, nontender, bowel sounds present. Extremities: No pitting edema, distal pulses 2+. Skin: Warm and dry. Musculoskeletal: No kyphosis. Neuropsychiatric: Alert and oriented x3, affect grossly appropriate.  ECG: I personally reviewed the tracing from 01/05/2017 which showed sinus bradycardia with short PR interval and nonspecific ST changes.  Recent Labwork:  October 2017: Hemoglobin 13.6, platelets 323, BUN 12, creatinine 0.73, potassium 4.4,  AST 19, ALT 13, cholesterol 188, triglycerides 122, HDL 66, LDL 98, TSH 2.0  Other Studies Reviewed Today:  Cardiac catheterization 12/04/2013: Hemodynamic Findings: Central aortic pressure: 164/68 Left ventricular pressure: 155/6/11  Angiographic Findings:  Left main: Long smooth vessel that bifurcates into the LAD and the Circumflex. The proximal segment just beyond the ostium has an eccentric 30% stenosis. The remainder of the vessel has no plaque disease.   Left Anterior Descending Artery: Large caliber vessel that courses to the apex. The proximal vessel has calcification with diffuse 20% stenosis. The mid and distal vessel is free of obstructive disease. The moderate caliber diagonal branch has no obstructive disease.   Circumflex Artery: Large caliber vessel with termination into a large caliber first obtuse marginal branch. The ostium of the Circumflex has an eccentric 30% calcified stenosis. The obtuse marginal branch is free of obstructive disease.   Right Coronary Artery: Large caliber dominant vessel with 30% proximal stenosis, 20% mid stenosis.   Left Ventricular Angiogram: LVEF=65%.   Impression: 1. Mild non-obstructive CAD 2. Normal LV systolic function  Assessment and Plan:  1.  Mild coronary atherosclerosis by cardiac catheterization in 2015.  She reports no active angina symptoms or change in stamina.  Would continue exercise plan.  She remains on aspirin, also red yeast rice (she prefers this to statin therapy).  2.  Mixed hyperlipidemia.  She has a history of statin intolerance, currently on red yeast rice.  I talked with her about considering omega-3 supplements as well if in fact her triglycerides are elevated.  Also dietary modifications.  Requesting interval lab work.  3.  Sinus bradycardia, asymptomatic.  ECG reviewed.  4.  Essential hypertension, not currently on antihypertensive therapy.  She states that she is following blood pressure periodically  at home and reports no systolics over 509.  Keep follow-up with Dr. Woody Seller.  Current medicines were reviewed with the patient today.   Orders Placed This Encounter  Procedures  . EKG 12-Lead    Disposition: Follow-up in 1 year.  Signed, Satira Sark, MD, [facility] 01/09/2018 9:09 AM    [facility] [place], [place], [place] [address] Phone: [phone]; Fax: [phone]

## 2018-01-09 ENCOUNTER — Ambulatory Visit: Payer: Medicare Other | Admitting: Cardiology

## 2018-01-09 ENCOUNTER — Encounter: Payer: Self-pay | Admitting: *Deleted

## 2018-01-09 ENCOUNTER — Encounter: Payer: Self-pay | Admitting: Cardiology

## 2018-01-09 VITALS — BP 148/64 | HR 47 | Ht 62.0 in | Wt 124.0 lb

## 2018-01-09 DIAGNOSIS — I2510 Atherosclerotic heart disease of native coronary artery without angina pectoris: Secondary | ICD-10-CM

## 2018-01-09 DIAGNOSIS — I10 Essential (primary) hypertension: Secondary | ICD-10-CM | POA: Diagnosis not present

## 2018-01-09 DIAGNOSIS — E782 Mixed hyperlipidemia: Secondary | ICD-10-CM | POA: Diagnosis not present

## 2018-01-09 DIAGNOSIS — R001 Bradycardia, unspecified: Secondary | ICD-10-CM

## 2018-01-09 NOTE — Patient Instructions (Addendum)
Medication Instructions:   Your physician recommends that you continue on your current medications as directed. Please refer to the Current Medication list given to you today.  Labwork:  NONE-last lab work requested from your family doctor.  Testing/Procedures:  NONE  Follow-Up:  Your physician recommends that you schedule a follow-up appointment in: 1 year. You will receive a reminder letter in the mail in about 10 months reminding you to call and schedule your appointment. If you don't receive this letter, please contact our office.  Any Other Special Instructions Will Be Listed Below (If Applicable).  If you need a refill on your cardiac medications before your next appointment, please call your pharmacy.

## 2019-04-19 ENCOUNTER — Ambulatory Visit: Payer: Medicare Other | Admitting: Cardiology

## 2019-05-03 ENCOUNTER — Encounter: Payer: Self-pay | Admitting: *Deleted

## 2019-05-03 ENCOUNTER — Other Ambulatory Visit: Payer: Self-pay

## 2019-05-03 ENCOUNTER — Ambulatory Visit: Payer: Medicare Other | Admitting: Cardiology

## 2019-05-03 ENCOUNTER — Encounter: Payer: Self-pay | Admitting: Cardiology

## 2019-05-03 VITALS — BP 186/74 | HR 57 | Ht 62.0 in | Wt 126.8 lb

## 2019-05-03 DIAGNOSIS — E782 Mixed hyperlipidemia: Secondary | ICD-10-CM | POA: Diagnosis not present

## 2019-05-03 DIAGNOSIS — I10 Essential (primary) hypertension: Secondary | ICD-10-CM

## 2019-05-03 DIAGNOSIS — I2510 Atherosclerotic heart disease of native coronary artery without angina pectoris: Secondary | ICD-10-CM | POA: Diagnosis not present

## 2019-05-03 NOTE — Progress Notes (Signed)
Cardiology Office Note  Date: 05/03/2019   ID: [name], DOB 01/22/1946, MRN [id number]  PCP:  Glenda Chroman, MD  Cardiologist:  Rozann Lesches, MD Electrophysiologist:  None   Chief Complaint  Patient presents with  . Cardiac follow-up    History of Present Illness: [name] is a 74 y.o. female last seen in October 2019.  She presents for a routine visit.  States that she is still working, has remained healthy without obvious COVID-19 exposures.  She states that she has been careful about social distancing and wearing a mask.  From a cardiac perspective, she does not report any angina symptoms.  She remains on an aspirin daily.  She states that she had lab work with Dr. Woody Seller last year which we are requesting for review.  I personally reviewed her ECG today which shows sinus bradycardia with decreased R wave progression and nonspecific ST-T changes.  Pressure was elevated today.  She states that when she took it this morning her systolic was in the [id number].  She has prior allergy to lisinopril, also intolerance of atenolol.  I asked her to track her blood pressure more regularly at home and follow-up with her PCP.  She might benefit from chlorthalidone.  Past Medical History:  Diagnosis Date  . Bladder cancer ([place])   . Coronary atherosclerosis    Mild by heart catheterization August 2015  . Essential hypertension   . Osteoporosis   . Vertigo     Past Surgical History:  Procedure Laterality Date  . ABDOMINAL HYSTERECTOMY    . CATARACT EXTRACTION W/PHACO Left 10/30/2012   Procedure: CATARACT EXTRACTION PHACO AND INTRAOCULAR LENS PLACEMENT (IOC);  Surgeon: Elta Guadeloupe T. Gershon Crane, MD;  Location: [facility];  Service: Ophthalmology;  Laterality: Left;  CDE:6.00  . LEFT HEART CATHETERIZATION WITH CORONARY ANGIOGRAM N/A 12/04/2013   Procedure: LEFT HEART CATHETERIZATION WITH CORONARY ANGIOGRAM;  Surgeon: Burnell Blanks, MD;  Location: [facility] CATH LAB;  Service:  Cardiovascular;  Laterality: N/A;  . RETINAL DETACHMENT SURGERY Right   . TRANSURETHRAL RESECTION OF BLADDER  1999?   Dr. Maryland Pink, [facility]    Current Outpatient Medications  Medication Sig Dispense Refill  . aspirin 81 MG tablet Take 81 mg by mouth daily.    . Magnesium Oxide (MAG-OX PO) Take by mouth daily.     . Omega-3 Fatty Acids (FISH OIL PO) 1 teaspoon by mouth daily    . vitamin C (ASCORBIC ACID) 500 MG tablet Take 500 mg by mouth daily.    . Zinc Sulfate (ZINC 15 PO) Take by mouth daily.      No current facility-administered medications for this visit.   Allergies:  Lisinopril and Codeine   Social History: The patient  reports that she quit smoking about 15 years ago. Her smoking use included cigarettes. She has never used smokeless tobacco. She reports that she does not drink alcohol or use drugs.   ROS:  Please see the history of present illness. Otherwise, complete review of systems is positive for none.  All other systems are reviewed and negative.   Physical Exam: VS:  BP (!) 186/74   Pulse (!) 57   Ht 5\' 2"  (1.575 m)   Wt 126 lb 12.8 oz (57.5 kg)   SpO2 98%   BMI 23.19 kg/m , BMI Body mass index is 23.19 kg/m.  Wt Readings from Last 3 Encounters:  05/03/19 126 lb 12.8 oz (57.5 kg)  01/09/18 124 lb (56.2 kg)  01/05/17  120 lb (54.4 kg)    General: Patient appears comfortable at rest. HEENT: Conjunctiva and lids normal. Neck: Supple, no elevated JVP or carotid bruits, no thyromegaly. Lungs: Clear to auscultation, nonlabored breathing at rest. Cardiac: Regular rate and rhythm, no S3 or significant systolic murmur. Abdomen: Soft, nontender, bowel sounds present. Extremities: No pitting edema, distal pulses 2+.  ECG:  An ECG dated 01/09/2018 was personally reviewed today and demonstrated:  Sinus bradycardia with low voltage and nonspecific T wave changes.  Recent Labwork:  September 2019: Hemoglobin 13.3, platelets 319, TSH 2.07, cholesterol 186, triglycerides  229, HDL 56, LDL 84, creatinine 0.72, potassium 5.1, AST 19, ALT 13  Other Studies Reviewed Today:  Cardiac catheterization 12/04/2013: Hemodynamic Findings: Central aortic pressure: 164/68 Left ventricular pressure: 155/6/11  Angiographic Findings:  Left main: Long smooth vessel that bifurcates into the LAD and the Circumflex. The proximal segment just beyond the ostium has an eccentric 30% stenosis. The remainder of the vessel has no plaque disease.   Left Anterior Descending Artery: Large caliber vessel that courses to the apex. The proximal vessel has calcification with diffuse 20% stenosis. The mid and distal vessel is free of obstructive disease. The moderate caliber diagonal branch has no obstructive disease.   Circumflex Artery: Large caliber vessel with termination into a large caliber first obtuse marginal branch. The ostium of the Circumflex has an eccentric 30% calcified stenosis. The obtuse marginal branch is free of obstructive disease.   Right Coronary Artery: Large caliber dominant vessel with 30% proximal stenosis, 20% mid stenosis.   Left Ventricular Angiogram: LVEF=65%.   Impression: 1. Mild non-obstructive CAD 2. Normal LV systolic function  Assessment and Plan:  1.  Mild coronary atherosclerosis by previous cardiac catheterization in 2015.  She does not report any obvious angina, ECG reviewed and stable.  Would continue aspirin and observation.  She has preferred to hold off on statin therapy.  Her last LDL was 84 in 2019.  We are requesting her interval lab work.  2.  Essential hypertension.  Blood pressure up on today's check.  I have asked her to track blood pressure more regularly at home and follow-up with Dr. Woody Seller as before.  With resting bradycardia and previous lisinopril allergy, chlorthalidone might be a good choice.   Medication Adjustments/Labs and Tests Ordered: Current medicines are reviewed at length with the patient today.  Concerns  regarding medicines are outlined above.   Tests Ordered: Orders Placed This Encounter  Procedures  . EKG 12-Lead    Medication Changes: No orders of the defined types were placed in this encounter.   Disposition:  Follow up 1 year in the [place] office.  Signed, Satira Sark, MD, [facility] 05/03/2019 4:22 PM    [facility] [place], [place], [place] [address] Phone: [phone]; Fax: [phone]

## 2019-05-03 NOTE — Patient Instructions (Addendum)

## 2019-07-08 DIAGNOSIS — R300 Dysuria: Secondary | ICD-10-CM | POA: Diagnosis not present

## 2019-07-08 DIAGNOSIS — Z299 Encounter for prophylactic measures, unspecified: Secondary | ICD-10-CM | POA: Diagnosis not present

## 2019-07-08 DIAGNOSIS — N390 Urinary tract infection, site not specified: Secondary | ICD-10-CM | POA: Diagnosis not present

## 2019-07-08 DIAGNOSIS — Z789 Other specified health status: Secondary | ICD-10-CM | POA: Diagnosis not present

## 2019-07-08 DIAGNOSIS — I700 Atherosclerosis of aorta: Secondary | ICD-10-CM | POA: Diagnosis not present

## 2019-07-08 DIAGNOSIS — I10 Essential (primary) hypertension: Secondary | ICD-10-CM | POA: Diagnosis not present

## 2019-08-08 DIAGNOSIS — I10 Essential (primary) hypertension: Secondary | ICD-10-CM | POA: Diagnosis not present

## 2019-08-08 DIAGNOSIS — I700 Atherosclerosis of aorta: Secondary | ICD-10-CM | POA: Diagnosis not present

## 2019-08-08 DIAGNOSIS — M810 Age-related osteoporosis without current pathological fracture: Secondary | ICD-10-CM | POA: Diagnosis not present

## 2019-08-08 DIAGNOSIS — Z299 Encounter for prophylactic measures, unspecified: Secondary | ICD-10-CM | POA: Diagnosis not present

## 2019-08-29 DIAGNOSIS — Z299 Encounter for prophylactic measures, unspecified: Secondary | ICD-10-CM | POA: Diagnosis not present

## 2019-08-29 DIAGNOSIS — I700 Atherosclerosis of aorta: Secondary | ICD-10-CM | POA: Diagnosis not present

## 2019-08-29 DIAGNOSIS — I10 Essential (primary) hypertension: Secondary | ICD-10-CM | POA: Diagnosis not present

## 2019-09-04 DIAGNOSIS — M810 Age-related osteoporosis without current pathological fracture: Secondary | ICD-10-CM | POA: Diagnosis not present

## 2019-09-08 DIAGNOSIS — I10 Essential (primary) hypertension: Secondary | ICD-10-CM | POA: Diagnosis not present

## 2019-09-19 DIAGNOSIS — I700 Atherosclerosis of aorta: Secondary | ICD-10-CM | POA: Diagnosis not present

## 2019-09-19 DIAGNOSIS — R5383 Other fatigue: Secondary | ICD-10-CM | POA: Diagnosis not present

## 2019-09-19 DIAGNOSIS — I10 Essential (primary) hypertension: Secondary | ICD-10-CM | POA: Diagnosis not present

## 2019-09-19 DIAGNOSIS — Z299 Encounter for prophylactic measures, unspecified: Secondary | ICD-10-CM | POA: Diagnosis not present

## 2019-10-09 DIAGNOSIS — I10 Essential (primary) hypertension: Secondary | ICD-10-CM | POA: Diagnosis not present

## 2019-11-08 DIAGNOSIS — I10 Essential (primary) hypertension: Secondary | ICD-10-CM | POA: Diagnosis not present

## 2019-11-18 DIAGNOSIS — I10 Essential (primary) hypertension: Secondary | ICD-10-CM | POA: Diagnosis not present

## 2019-11-18 DIAGNOSIS — I700 Atherosclerosis of aorta: Secondary | ICD-10-CM | POA: Diagnosis not present

## 2019-11-18 DIAGNOSIS — R300 Dysuria: Secondary | ICD-10-CM | POA: Diagnosis not present

## 2019-11-18 DIAGNOSIS — N390 Urinary tract infection, site not specified: Secondary | ICD-10-CM | POA: Diagnosis not present

## 2019-11-18 DIAGNOSIS — Z299 Encounter for prophylactic measures, unspecified: Secondary | ICD-10-CM | POA: Diagnosis not present

## 2019-12-10 DIAGNOSIS — I10 Essential (primary) hypertension: Secondary | ICD-10-CM | POA: Diagnosis not present

## 2019-12-24 DIAGNOSIS — I700 Atherosclerosis of aorta: Secondary | ICD-10-CM | POA: Diagnosis not present

## 2019-12-24 DIAGNOSIS — Z299 Encounter for prophylactic measures, unspecified: Secondary | ICD-10-CM | POA: Diagnosis not present

## 2019-12-24 DIAGNOSIS — I10 Essential (primary) hypertension: Secondary | ICD-10-CM | POA: Diagnosis not present

## 2020-01-09 DIAGNOSIS — I10 Essential (primary) hypertension: Secondary | ICD-10-CM | POA: Diagnosis not present

## 2020-01-15 DIAGNOSIS — N390 Urinary tract infection, site not specified: Secondary | ICD-10-CM | POA: Diagnosis not present

## 2020-01-15 DIAGNOSIS — Z299 Encounter for prophylactic measures, unspecified: Secondary | ICD-10-CM | POA: Diagnosis not present

## 2020-01-15 DIAGNOSIS — R300 Dysuria: Secondary | ICD-10-CM | POA: Diagnosis not present

## 2020-01-15 DIAGNOSIS — I700 Atherosclerosis of aorta: Secondary | ICD-10-CM | POA: Diagnosis not present

## 2020-01-15 DIAGNOSIS — I10 Essential (primary) hypertension: Secondary | ICD-10-CM | POA: Diagnosis not present

## 2020-01-23 DIAGNOSIS — Z0000 Encounter for general adult medical examination without abnormal findings: Secondary | ICD-10-CM | POA: Diagnosis not present

## 2020-01-23 DIAGNOSIS — Z299 Encounter for prophylactic measures, unspecified: Secondary | ICD-10-CM | POA: Diagnosis not present

## 2020-01-23 DIAGNOSIS — I10 Essential (primary) hypertension: Secondary | ICD-10-CM | POA: Diagnosis not present

## 2020-01-23 DIAGNOSIS — Z23 Encounter for immunization: Secondary | ICD-10-CM | POA: Diagnosis not present

## 2020-01-23 DIAGNOSIS — M810 Age-related osteoporosis without current pathological fracture: Secondary | ICD-10-CM | POA: Diagnosis not present

## 2020-01-23 DIAGNOSIS — E7800 Pure hypercholesterolemia, unspecified: Secondary | ICD-10-CM | POA: Diagnosis not present

## 2020-01-23 DIAGNOSIS — Z79899 Other long term (current) drug therapy: Secondary | ICD-10-CM | POA: Diagnosis not present

## 2020-01-23 DIAGNOSIS — Z7189 Other specified counseling: Secondary | ICD-10-CM | POA: Diagnosis not present

## 2020-01-23 DIAGNOSIS — Z1211 Encounter for screening for malignant neoplasm of colon: Secondary | ICD-10-CM | POA: Diagnosis not present

## 2020-01-23 DIAGNOSIS — Z6824 Body mass index (BMI) 24.0-24.9, adult: Secondary | ICD-10-CM | POA: Diagnosis not present

## 2020-01-23 DIAGNOSIS — R5383 Other fatigue: Secondary | ICD-10-CM | POA: Diagnosis not present

## 2020-02-07 DIAGNOSIS — M818 Other osteoporosis without current pathological fracture: Secondary | ICD-10-CM | POA: Diagnosis not present

## 2020-02-08 DIAGNOSIS — I10 Essential (primary) hypertension: Secondary | ICD-10-CM | POA: Diagnosis not present

## 2020-02-10 DIAGNOSIS — I351 Nonrheumatic aortic (valve) insufficiency: Secondary | ICD-10-CM | POA: Diagnosis not present

## 2020-02-21 DIAGNOSIS — I10 Essential (primary) hypertension: Secondary | ICD-10-CM | POA: Diagnosis not present

## 2020-02-21 DIAGNOSIS — M25561 Pain in right knee: Secondary | ICD-10-CM | POA: Diagnosis not present

## 2020-02-21 DIAGNOSIS — N183 Chronic kidney disease, stage 3 unspecified: Secondary | ICD-10-CM | POA: Diagnosis not present

## 2020-02-21 DIAGNOSIS — Z299 Encounter for prophylactic measures, unspecified: Secondary | ICD-10-CM | POA: Diagnosis not present

## 2020-02-21 DIAGNOSIS — M25661 Stiffness of right knee, not elsewhere classified: Secondary | ICD-10-CM | POA: Diagnosis not present

## 2020-03-02 DIAGNOSIS — M810 Age-related osteoporosis without current pathological fracture: Secondary | ICD-10-CM | POA: Diagnosis not present

## 2020-03-10 DIAGNOSIS — I10 Essential (primary) hypertension: Secondary | ICD-10-CM | POA: Diagnosis not present

## 2020-03-13 ENCOUNTER — Other Ambulatory Visit: Payer: Self-pay | Admitting: Internal Medicine

## 2020-03-13 DIAGNOSIS — Z1231 Encounter for screening mammogram for malignant neoplasm of breast: Secondary | ICD-10-CM

## 2020-03-16 ENCOUNTER — Other Ambulatory Visit: Payer: Self-pay

## 2020-03-16 ENCOUNTER — Ambulatory Visit
Admission: RE | Admit: 2020-03-16 | Discharge: 2020-03-16 | Disposition: A | Discharge status: Home / Self Care | Payer: Medicare Other | Source: Ambulatory Visit | Attending: Internal Medicine | Admitting: Internal Medicine

## 2020-03-16 DIAGNOSIS — Z1231 Encounter for screening mammogram for malignant neoplasm of breast: Secondary | ICD-10-CM | POA: Diagnosis not present

## 2020-04-09 DIAGNOSIS — I10 Essential (primary) hypertension: Secondary | ICD-10-CM | POA: Diagnosis not present

## 2020-05-07 DIAGNOSIS — I700 Atherosclerosis of aorta: Secondary | ICD-10-CM | POA: Diagnosis not present

## 2020-05-07 DIAGNOSIS — I10 Essential (primary) hypertension: Secondary | ICD-10-CM | POA: Diagnosis not present

## 2020-05-07 DIAGNOSIS — N183 Chronic kidney disease, stage 3 unspecified: Secondary | ICD-10-CM | POA: Diagnosis not present

## 2020-05-07 DIAGNOSIS — Z87891 Personal history of nicotine dependence: Secondary | ICD-10-CM | POA: Diagnosis not present

## 2020-05-07 DIAGNOSIS — Z299 Encounter for prophylactic measures, unspecified: Secondary | ICD-10-CM | POA: Diagnosis not present

## 2020-05-11 DIAGNOSIS — I10 Essential (primary) hypertension: Secondary | ICD-10-CM | POA: Diagnosis not present

## 2020-05-18 ENCOUNTER — Encounter (INDEPENDENT_AMBULATORY_CARE_PROVIDER_SITE_OTHER): Payer: Self-pay | Admitting: Ophthalmology

## 2020-05-18 ENCOUNTER — Ambulatory Visit (INDEPENDENT_AMBULATORY_CARE_PROVIDER_SITE_OTHER): Payer: Medicare Other | Admitting: Ophthalmology

## 2020-05-18 ENCOUNTER — Other Ambulatory Visit: Payer: Self-pay

## 2020-05-18 DIAGNOSIS — H35072 Retinal telangiectasis, left eye: Secondary | ICD-10-CM | POA: Diagnosis not present

## 2020-05-18 DIAGNOSIS — H35372 Puckering of macula, left eye: Secondary | ICD-10-CM | POA: Diagnosis not present

## 2020-05-18 DIAGNOSIS — H35071 Retinal telangiectasis, right eye: Secondary | ICD-10-CM

## 2020-05-18 DIAGNOSIS — H33101 Unspecified retinoschisis, right eye: Secondary | ICD-10-CM

## 2020-05-18 DIAGNOSIS — Z8669 Personal history of other diseases of the nervous system and sense organs: Secondary | ICD-10-CM | POA: Diagnosis not present

## 2020-05-18 DIAGNOSIS — H43812 Vitreous degeneration, left eye: Secondary | ICD-10-CM

## 2020-05-18 DIAGNOSIS — H35351 Cystoid macular degeneration, right eye: Secondary | ICD-10-CM | POA: Diagnosis not present

## 2020-05-18 DIAGNOSIS — H35371 Puckering of macula, right eye: Secondary | ICD-10-CM | POA: Diagnosis not present

## 2020-05-18 MED ORDER — FLUORESCEIN SODIUM 10 % IV SOLN
500.0000 mg | INTRAVENOUS | Status: AC | PRN
  Administered 2020-05-18: 500 mg via INTRAVENOUS

## 2020-05-18 NOTE — Progress Notes (Signed)
05/18/2020     CHIEF COMPLAINT Patient presents for Retina Follow Up (3 Year F/U OU//Pt denies noticeable changes to [place] OU since last visit. Pt denies ocular pain, flashes of light, or floaters OU. //)   HISTORY OF PRESENT ILLNESS: [name] is a 75 y.o. female who presents to the clinic today for:   HPI    Retina Follow Up    Patient presents with  Other.  In both eyes.  This started 3 years ago.  Severity is mild.  Duration of 3 years.  Since onset it is stable. Additional comments: 3 Year F/U OU  Pt denies noticeable changes to [place] OU since last visit. Pt denies ocular pain, flashes of light, or floaters OU.          Last edited by Rockie Neighbours, [place] on 05/18/2020  9:58 AM. (History)      Referring physician: Glenda Chroman, MD [place],   [id number]  HISTORICAL INFORMATION:   Selected notes from the [facility]: No current outpatient medications on file. (Ophthalmic Drugs)   No current facility-administered medications for this visit. (Ophthalmic Drugs)   Current Outpatient Medications (Other)  Medication Sig  . aspirin 81 MG tablet Take 81 mg by mouth daily.  . Magnesium Oxide (MAG-OX PO) Take by mouth daily.   . Omega-3 Fatty Acids (FISH OIL PO) 1 teaspoon by mouth daily  . vitamin C (ASCORBIC ACID) 500 MG tablet Take 500 mg by mouth daily.  . Zinc Sulfate (ZINC 15 PO) Take by mouth daily.    No current facility-administered medications for this visit. (Other)      REVIEW OF SYSTEMS:    ALLERGIES Allergies  Allergen Reactions  . Lisinopril     cough  . Codeine Other (See Comments)    Makes "jittery"    PAST MEDICAL HISTORY Past Medical History:  Diagnosis Date  . Bladder cancer ([place])   . Coronary atherosclerosis    Mild by heart catheterization August 2015  . Essential hypertension   . Osteoporosis   . Vertigo    Past Surgical History:  Procedure Laterality Date  . ABDOMINAL  HYSTERECTOMY    . CATARACT EXTRACTION W/PHACO Left 10/30/2012   Procedure: CATARACT EXTRACTION PHACO AND INTRAOCULAR LENS PLACEMENT (IOC);  Surgeon: Elta Guadeloupe T. Gershon Crane, MD;  Location: [facility];  Service: Ophthalmology;  Laterality: Left;  CDE:6.00  . LEFT HEART CATHETERIZATION WITH CORONARY ANGIOGRAM N/A 12/04/2013   Procedure: LEFT HEART CATHETERIZATION WITH CORONARY ANGIOGRAM;  Surgeon: Burnell Blanks, MD;  Location: [facility] CATH LAB;  Service: Cardiovascular;  Laterality: N/A;  . RETINAL DETACHMENT SURGERY Right   . TRANSURETHRAL RESECTION OF BLADDER  1999?   Dr. [place] Pink, [facility]    FAMILY HISTORY Family History  Problem Relation Age of Onset  . Heart attack Father        Age 79  . Dementia Mother     SOCIAL HISTORY Social History   Tobacco Use  . Smoking status: Former Smoker    Types: Cigarettes    Quit date: 11/20/2003    Years since quitting: 16.5  . Smokeless tobacco: Never Used  . Tobacco comment: quit about 10 years ago  Substance Use Topics  . Alcohol use: No    Alcohol/week: 0.0 standard drinks  . Drug use: No         OPHTHALMIC EXAM: Base Eye Exam    Visual Acuity (ETDRS)  Right Left   Dist [place] 20/400 20/25 +2   Dist ph [place] 20/70 -1        Tonometry (Tonopen, 9:58 AM)      Right Left   Pressure 18 15       Pupils      Pupils Dark Light Shape React APD   Right PERRL 5 4 Round Brisk None   Left PERRL 5 4 Round Brisk None       Visual Fields (Counting fingers)      Left Right    Full    Restrictions  Total superior temporal deficiency       Extraocular Movement      Right Left    Full Full       Neuro/Psych    Oriented x3: Yes   Mood/Affect: Normal       Dilation    Both eyes: 1.0% Mydriacyl, 2.5% Phenylephrine @ 10:02 AM          IMAGING AND PROCEDURES  Imaging and Procedures for 05/18/20  OCT, Retina - OU - Both Eyes       Right Eye Quality was good. Scan locations included subfoveal. Central Foveal Thickness: 318. Findings  include cystoid macular edema, epiretinal membrane, abnormal foveal contour, no SRF.   Left Eye Central Foveal Thickness: 354. Progression has been stable. Findings include epiretinal membrane, retinal drusen , no SRF.   Notes Minor inner retinal perifoveal CME OD increased since last visit some 1.5 years ago with mild epiretinal membrane.  No signs of CNVM OD.  OS, similarly moderate ARMD, no CNVM, mild epiretinal membrane with no overt clinical CME       Fluorescein Angiography [place] (Transit OD)       Injection:  500 mg Fluorescein Sodium 10 % injection   NDC: [phone]   Route: IntravenousRight Eye   Progression has no prior data. Early phase findings include window defect, microaneurysm. Mid/Late phase findings include window defect, microaneurysm, leakage. Choroidal neovascularization is not present.   Left Eye   Progression has no prior data. Mid/Late phase findings include microaneurysm. Choroidal neovascularization is not present.   Notes Small perifoveal microaneurysms in the macula right and left eye does suggest presence of MAC-TEL however patient is not trouble with active symptoms of sleep apnea by review of systems.  Nonetheless the underlying macular degeneration noted with hard drusen there is no sign of CNVM in either eye.       Color Fundus Photography Optos - OU - Both Eyes       Right Eye Progression has no prior data. Disc findings include normal observations. Macula : retinal pigment epithelium abnormalities, drusen.   Left Eye Progression has no prior data. Disc findings include normal observations. Macula : drusen.   Notes OD, status post complex retinal detachment repair years past, stable, minor epiretinal membrane no topographic distortion to the macula and no clinical signs on color fundus photography of CNVM.  OS with mild to moderate ARMD as well.                ASSESSMENT/PLAN:  Cystoid macular edema of right  eye Cystoid macular edema in the nasal aspect of the fovea, could be related to to the epiretinal membrane in this region or could be macular telangiectasis worsening.  Will need formal evaluation with [place] fundus fluorescein angiography OU.  Retinal telangiectasia of left eye Small perifoveal microaneurysms in the macula right and left eye does suggest presence of MAC-TEL however patient  is not trouble with active symptoms of sleep apnea by review of systems.   Type 2 macular telangiectasis, right Minor and confirmed by FFA OU yet some component of of prior retinal detachment OD and overlying mild epiretinal membrane may be playing a role here.  No signs of active disease.  Patient does not have active review of systems for macular telangiectasias triggered by sleep apnea, with negative review of systems currently.  We will continue simply to observe      ICD-10-CM   1. Cystoid macular edema of right eye  H35.351 Fluorescein Angiography [place] (Transit OD)    Fluorescein Sodium 10 % injection 500 mg    Color Fundus Photography Optos - OU - Both Eyes  2. Left epiretinal membrane  H35.372 OCT, Retina - OU - Both Eyes    Fluorescein Angiography [place] (Transit OD)    Fluorescein Sodium 10 % injection 500 mg    Color Fundus Photography Optos - OU - Both Eyes  3. Right epiretinal membrane  H35.371 OCT, Retina - OU - Both Eyes    Fluorescein Angiography [place] (Transit OD)    Fluorescein Sodium 10 % injection 500 mg  4. Retinal telangiectasia of left eye  H35.072 Fluorescein Angiography [place] (Transit OD)    Fluorescein Sodium 10 % injection 500 mg  5. 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 Posterior vitreous detachment of left eye  H43.812   7. History of retinal detachment  Z86.69   8. Type 2 macular telangiectasis, right  H35.071     1.  No active disease OU, will continue to observe and monitor.  2.  3.  Ophthalmic Meds Ordered this visit:  Meds ordered this  encounter  Medications  . Fluorescein Sodium 10 % injection 500 mg       Return in about 1 year (around 05/18/2021) for DILATE OU, COLOR FP, OCT.  There are no Patient Instructions on file for this visit.   Explained the diagnoses, plan, and follow up with the patient and they expressed understanding.  Patient expressed understanding of the importance of proper follow up care.   Clent Demark Kajsa Butrum M.D. Diseases & Surgery of the Retina and Vitreous [facility] [place] 05/18/20     Abbreviations: M myopia (nearsighted); A astigmatism; H hyperopia (farsighted); P presbyopia; Mrx spectacle prescription;  CTL contact lenses; OD right eye; OS left eye; OU both eyes  XT exotropia; ET esotropia; PEK punctate epithelial keratitis; PEE punctate epithelial erosions; DES dry eye syndrome; MGD meibomian gland dysfunction; ATs artificial tears; PFAT's preservative free artificial tears; [place] nuclear sclerotic cataract; PSC posterior subcapsular cataract; ERM epi-retinal membrane; PVD posterior vitreous detachment; RD retinal detachment; DM diabetes mellitus; DR diabetic retinopathy; NPDR non-proliferative diabetic retinopathy; PDR proliferative diabetic retinopathy; CSME clinically significant macular edema; DME diabetic macular edema; dbh dot blot hemorrhages; CWS cotton wool spot; POAG primary open angle glaucoma; C/D cup-to-disc ratio; HVF humphrey visual field; GVF goldmann visual field; OCT optical coherence tomography; IOP intraocular pressure; BRVO Branch retinal vein occlusion; CRVO central retinal vein occlusion; CRAO central retinal artery occlusion; BRAO branch retinal artery occlusion; RT retinal tear; SB scleral buckle; PPV pars plana vitrectomy; VH Vitreous hemorrhage; PRP panretinal laser photocoagulation; IVK intravitreal kenalog; VMT vitreomacular traction; MH Macular hole;  NVD neovascularization of the disc; NVE neovascularization elsewhere; AREDS age related eye disease study; ARMD age  related macular degeneration; POAG primary open angle glaucoma; EBMD epithelial/anterior basement membrane dystrophy; ACIOL anterior chamber intraocular lens; IOL intraocular lens; PCIOL  posterior chamber intraocular lens; Phaco/IOL phacoemulsification with intraocular lens placement; PRK photorefractive keratectomy; LASIK laser assisted in situ keratomileusis; HTN hypertension; DM diabetes mellitus; COPD chronic obstructive pulmonary disease 

## 2020-05-18 NOTE — Assessment & Plan Note (Signed)
Minor and confirmed by FFA OU yet some component of of prior retinal detachment OD and overlying mild epiretinal membrane may be playing a role here.  No signs of active disease.  Patient does not have active review of systems for macular telangiectasias triggered by sleep apnea, with negative review of systems currently.  We will continue simply to observe

## 2020-05-18 NOTE — Assessment & Plan Note (Signed)
Small perifoveal microaneurysms in the macula right and left eye does suggest presence of MAC-TEL however patient is not trouble with active symptoms of sleep apnea by review of systems.

## 2020-05-18 NOTE — Assessment & Plan Note (Signed)
Cystoid macular edema in the nasal aspect of the fovea, could be related to to the epiretinal membrane in this region or could be macular telangiectasis worsening.  Will need formal evaluation with [place] fundus fluorescein angiography OU.

## 2020-05-19 DIAGNOSIS — Z299 Encounter for prophylactic measures, unspecified: Secondary | ICD-10-CM | POA: Diagnosis not present

## 2020-05-19 DIAGNOSIS — N390 Urinary tract infection, site not specified: Secondary | ICD-10-CM | POA: Diagnosis not present

## 2020-05-19 DIAGNOSIS — I10 Essential (primary) hypertension: Secondary | ICD-10-CM | POA: Diagnosis not present

## 2020-05-19 DIAGNOSIS — N183 Chronic kidney disease, stage 3 unspecified: Secondary | ICD-10-CM | POA: Diagnosis not present

## 2020-05-19 DIAGNOSIS — I700 Atherosclerosis of aorta: Secondary | ICD-10-CM | POA: Diagnosis not present

## 2020-05-19 DIAGNOSIS — R350 Frequency of micturition: Secondary | ICD-10-CM | POA: Diagnosis not present

## 2020-06-08 DIAGNOSIS — I10 Essential (primary) hypertension: Secondary | ICD-10-CM | POA: Diagnosis not present

## 2020-06-22 DIAGNOSIS — J00 Acute nasopharyngitis [common cold]: Secondary | ICD-10-CM | POA: Diagnosis not present

## 2020-06-22 DIAGNOSIS — R0982 Postnasal drip: Secondary | ICD-10-CM | POA: Diagnosis not present

## 2020-07-09 DIAGNOSIS — I10 Essential (primary) hypertension: Secondary | ICD-10-CM | POA: Diagnosis not present

## 2020-08-07 DIAGNOSIS — I10 Essential (primary) hypertension: Secondary | ICD-10-CM | POA: Diagnosis not present

## 2020-08-12 DIAGNOSIS — I700 Atherosclerosis of aorta: Secondary | ICD-10-CM | POA: Diagnosis not present

## 2020-08-12 DIAGNOSIS — M810 Age-related osteoporosis without current pathological fracture: Secondary | ICD-10-CM | POA: Diagnosis not present

## 2020-08-12 DIAGNOSIS — Z299 Encounter for prophylactic measures, unspecified: Secondary | ICD-10-CM | POA: Diagnosis not present

## 2020-08-12 DIAGNOSIS — Z87891 Personal history of nicotine dependence: Secondary | ICD-10-CM | POA: Diagnosis not present

## 2020-08-12 DIAGNOSIS — I10 Essential (primary) hypertension: Secondary | ICD-10-CM | POA: Diagnosis not present

## 2020-09-08 DIAGNOSIS — I10 Essential (primary) hypertension: Secondary | ICD-10-CM | POA: Diagnosis not present

## 2020-09-09 DIAGNOSIS — M810 Age-related osteoporosis without current pathological fracture: Secondary | ICD-10-CM | POA: Diagnosis not present

## 2020-11-01 NOTE — Progress Notes (Signed)
Cardiology Office Note  Date: 11/02/2020   ID: [name], DOB [birth year], MRN [id number]  PCP:  Glenda Chroman, MD  Cardiologist:  Rozann Lesches, MD Electrophysiologist:  None   Chief Complaint  Patient presents with   Cardiac follow-up    History of Present Illness: [name] is a 75 y.o. female last seen in January 2021.  She is here for a follow-up visit.  Reports no exertional chest pain, good exercise tolerance.  She dances 3 days a week and also walks 40 minutes at a time 3 days a week.  I reviewed her medications.  She continues on aspirin daily.  She was also on HCTZ per PCP, but ultimately based on home blood pressure checks came off of the medications secondary to hypotension.  I reviewed her home blood pressure checks today and it looks like she mainly has whitecoat hypertension.  We discussed once weekly statin therapy in light of her atherosclerosis.  Last LDL was 85.  She was not enthusiastic about starting statin therapy.  I told her that we could consider starting Crestor 10 mg once weekly if she were agreeable.  I personally reviewed her ECG today which shows sinus bradycardia with leftward axis and nonspecific ST changes.  Past Medical History:  Diagnosis Date   Bladder cancer ([place])    Coronary atherosclerosis    Mild by heart catheterization August 2015   Essential hypertension    Osteoporosis    Vertigo     Past Surgical History:  Procedure Laterality Date   ABDOMINAL HYSTERECTOMY     CATARACT EXTRACTION W/PHACO Left 10/30/2012   Procedure: CATARACT EXTRACTION PHACO AND INTRAOCULAR LENS PLACEMENT ([place]);  Surgeon: Elta Guadeloupe T. Gershon Crane, MD;  Location: [facility];  Service: Ophthalmology;  Laterality: Left;  CDE:6.00   LEFT HEART CATHETERIZATION WITH CORONARY ANGIOGRAM N/A 12/04/2013   Procedure: LEFT HEART CATHETERIZATION WITH CORONARY ANGIOGRAM;  Surgeon: Burnell Blanks, MD;  Location: [facility] CATH LAB;  Service: Cardiovascular;  Laterality:  N/A;   RETINAL DETACHMENT SURGERY Right    TRANSURETHRAL RESECTION OF BLADDER  1999?   Dr. [place] Pink, [facility]    Current Outpatient Medications  Medication Sig Dispense Refill   aspirin 81 MG tablet Take 81 mg by mouth daily.     Magnesium Oxide (MAG-OX PO) Take by mouth daily.      Omega-3 Fatty Acids (FISH OIL PO) 1 teaspoon by mouth daily     vitamin C (ASCORBIC ACID) 500 MG tablet Take 500 mg by mouth daily.     Zinc Sulfate (ZINC 15 PO) Take by mouth daily.      No current facility-administered medications for this visit.   Allergies:  Lisinopril and Codeine   ROS: No palpitations or syncope.  Physical Exam: VS:  BP (!) 180/78   Pulse (!) 51   Ht '5\' 2"'$  (1.575 m)   Wt 125 lb (56.7 kg)   SpO2 97%   BMI 22.86 kg/m , BMI Body mass index is 22.86 kg/m.  Wt Readings from Last 3 Encounters:  11/02/20 125 lb (56.7 kg)  05/03/19 126 lb 12.8 oz (57.5 kg)  01/09/18 124 lb (56.2 kg)    General: Patient appears comfortable at rest. HEENT: Conjunctiva and lids normal, wearing a mask. Neck: Supple, no elevated JVP or carotid bruits, no thyromegaly. Lungs: Clear to auscultation, nonlabored breathing at rest. Cardiac: Regular rate and rhythm, no S3 or significant systolic murmur, no pericardial rub. Extremities: No pitting edema.  ECG:  An  ECG dated 05/03/2019 was personally reviewed today and demonstrated:  Sinus bradycardia with decreased R wave progression and nonspecific ST-T changes.  Recent Labwork:  October 2020: Cholesterol 169, triglycerides 135, HDL 61, LDL 85 June 2021: Potassium 3.4, BUN 14, creatinine 0.81, AST 23, ALT 17  Other Studies Reviewed Today:  No interval cardiac testing for review today.  Assessment and Plan:  1.  Mild coronary atherosclerosis, asymptomatic without obvious angina.  She has a good exercise plan at this point and is clinically stable.  ECG reviewed.  Continue daily aspirin 81 mg.  I also talked with her about statin therapy, even once weekly  dosing of Crestor.  She wanted to hold off for now.  2.  Whitecoat hypertension.  Review of home blood pressure checks shows much improved numbers.  She came off HCTZ due to hypotension.  Medication Adjustments/Labs and Tests Ordered: Current medicines are reviewed at length with the patient today.  Concerns regarding medicines are outlined above.   Tests Ordered: Orders Placed This Encounter  Procedures   EKG 12-Lead     Medication Changes: No orders of the defined types were placed in this encounter.   Disposition:  Follow up  1 year.  Signed, Satira Sark, MD, [facility] 11/02/2020 11:07 AM    [facility] [place], [place], [place] [address] Phone: [phone]; Fax: [phone]

## 2020-11-02 ENCOUNTER — Ambulatory Visit: Payer: Medicare Other | Admitting: Cardiology

## 2020-11-02 ENCOUNTER — Other Ambulatory Visit: Payer: Self-pay

## 2020-11-02 ENCOUNTER — Encounter: Payer: Self-pay | Admitting: Cardiology

## 2020-11-02 VITALS — BP 180/78 | HR 51 | Ht 62.0 in | Wt 125.0 lb

## 2020-11-02 DIAGNOSIS — I10 Essential (primary) hypertension: Secondary | ICD-10-CM | POA: Diagnosis not present

## 2020-11-02 DIAGNOSIS — I2510 Atherosclerotic heart disease of native coronary artery without angina pectoris: Secondary | ICD-10-CM

## 2020-11-02 NOTE — Patient Instructions (Signed)

## 2020-11-30 DIAGNOSIS — I700 Atherosclerosis of aorta: Secondary | ICD-10-CM | POA: Diagnosis not present

## 2020-11-30 DIAGNOSIS — Z299 Encounter for prophylactic measures, unspecified: Secondary | ICD-10-CM | POA: Diagnosis not present

## 2020-11-30 DIAGNOSIS — I10 Essential (primary) hypertension: Secondary | ICD-10-CM | POA: Diagnosis not present

## 2021-02-01 DIAGNOSIS — D225 Melanocytic nevi of trunk: Secondary | ICD-10-CM | POA: Diagnosis not present

## 2021-02-01 DIAGNOSIS — L818 Other specified disorders of pigmentation: Secondary | ICD-10-CM | POA: Diagnosis not present

## 2021-02-01 DIAGNOSIS — Z1283 Encounter for screening for malignant neoplasm of skin: Secondary | ICD-10-CM | POA: Diagnosis not present

## 2021-02-01 DIAGNOSIS — L814 Other melanin hyperpigmentation: Secondary | ICD-10-CM | POA: Diagnosis not present

## 2021-02-01 DIAGNOSIS — B078 Other viral warts: Secondary | ICD-10-CM | POA: Diagnosis not present

## 2021-02-01 DIAGNOSIS — L570 Actinic keratosis: Secondary | ICD-10-CM | POA: Diagnosis not present

## 2021-02-01 DIAGNOSIS — X32XXXA Exposure to sunlight, initial encounter: Secondary | ICD-10-CM | POA: Diagnosis not present

## 2021-02-04 DIAGNOSIS — D692 Other nonthrombocytopenic purpura: Secondary | ICD-10-CM | POA: Diagnosis not present

## 2021-02-04 DIAGNOSIS — Z87891 Personal history of nicotine dependence: Secondary | ICD-10-CM | POA: Diagnosis not present

## 2021-02-04 DIAGNOSIS — Z6824 Body mass index (BMI) 24.0-24.9, adult: Secondary | ICD-10-CM | POA: Diagnosis not present

## 2021-02-04 DIAGNOSIS — Z23 Encounter for immunization: Secondary | ICD-10-CM | POA: Diagnosis not present

## 2021-02-04 DIAGNOSIS — Z7189 Other specified counseling: Secondary | ICD-10-CM | POA: Diagnosis not present

## 2021-02-04 DIAGNOSIS — N1831 Chronic kidney disease, stage 3a: Secondary | ICD-10-CM | POA: Diagnosis not present

## 2021-02-04 DIAGNOSIS — Z0000 Encounter for general adult medical examination without abnormal findings: Secondary | ICD-10-CM | POA: Diagnosis not present

## 2021-02-04 DIAGNOSIS — I10 Essential (primary) hypertension: Secondary | ICD-10-CM | POA: Diagnosis not present

## 2021-02-04 DIAGNOSIS — R5383 Other fatigue: Secondary | ICD-10-CM | POA: Diagnosis not present

## 2021-02-04 DIAGNOSIS — Z299 Encounter for prophylactic measures, unspecified: Secondary | ICD-10-CM | POA: Diagnosis not present

## 2021-02-05 DIAGNOSIS — R5383 Other fatigue: Secondary | ICD-10-CM | POA: Diagnosis not present

## 2021-02-05 DIAGNOSIS — E7800 Pure hypercholesterolemia, unspecified: Secondary | ICD-10-CM | POA: Diagnosis not present

## 2021-02-05 DIAGNOSIS — Z79899 Other long term (current) drug therapy: Secondary | ICD-10-CM | POA: Diagnosis not present

## 2021-02-16 DIAGNOSIS — Z1212 Encounter for screening for malignant neoplasm of rectum: Secondary | ICD-10-CM | POA: Diagnosis not present

## 2021-02-16 DIAGNOSIS — Z1211 Encounter for screening for malignant neoplasm of colon: Secondary | ICD-10-CM | POA: Diagnosis not present

## 2021-03-16 ENCOUNTER — Other Ambulatory Visit: Payer: Self-pay | Admitting: Internal Medicine

## 2021-03-16 DIAGNOSIS — Z139 Encounter for screening, unspecified: Secondary | ICD-10-CM

## 2021-03-16 DIAGNOSIS — M810 Age-related osteoporosis without current pathological fracture: Secondary | ICD-10-CM | POA: Diagnosis not present

## 2021-03-22 ENCOUNTER — Ambulatory Visit
Admission: RE | Admit: 2021-03-22 | Discharge: 2021-03-22 | Disposition: A | Discharge status: Home / Self Care | Payer: Medicare Other | Source: Ambulatory Visit | Attending: Internal Medicine | Admitting: Internal Medicine

## 2021-03-22 ENCOUNTER — Other Ambulatory Visit: Payer: Self-pay

## 2021-03-22 DIAGNOSIS — Z139 Encounter for screening, unspecified: Secondary | ICD-10-CM

## 2021-03-22 DIAGNOSIS — Z1231 Encounter for screening mammogram for malignant neoplasm of breast: Secondary | ICD-10-CM | POA: Diagnosis not present

## 2021-04-29 DIAGNOSIS — Z87891 Personal history of nicotine dependence: Secondary | ICD-10-CM | POA: Diagnosis not present

## 2021-04-29 DIAGNOSIS — R42 Dizziness and giddiness: Secondary | ICD-10-CM | POA: Diagnosis not present

## 2021-04-29 DIAGNOSIS — Z299 Encounter for prophylactic measures, unspecified: Secondary | ICD-10-CM | POA: Diagnosis not present

## 2021-04-29 DIAGNOSIS — I700 Atherosclerosis of aorta: Secondary | ICD-10-CM | POA: Diagnosis not present

## 2021-04-29 DIAGNOSIS — D692 Other nonthrombocytopenic purpura: Secondary | ICD-10-CM | POA: Diagnosis not present

## 2021-04-29 DIAGNOSIS — I10 Essential (primary) hypertension: Secondary | ICD-10-CM | POA: Diagnosis not present

## 2021-05-24 ENCOUNTER — Encounter (INDEPENDENT_AMBULATORY_CARE_PROVIDER_SITE_OTHER): Payer: Medicare Other | Admitting: Ophthalmology

## 2021-06-07 ENCOUNTER — Encounter (INDEPENDENT_AMBULATORY_CARE_PROVIDER_SITE_OTHER): Payer: Self-pay | Admitting: Ophthalmology

## 2021-06-07 ENCOUNTER — Other Ambulatory Visit: Payer: Self-pay

## 2021-06-07 ENCOUNTER — Ambulatory Visit (INDEPENDENT_AMBULATORY_CARE_PROVIDER_SITE_OTHER): Payer: Medicare Other | Admitting: Ophthalmology

## 2021-06-07 DIAGNOSIS — H35351 Cystoid macular degeneration, right eye: Secondary | ICD-10-CM | POA: Diagnosis not present

## 2021-06-07 DIAGNOSIS — H353132 Nonexudative age-related macular degeneration, bilateral, intermediate dry stage: Secondary | ICD-10-CM

## 2021-06-07 DIAGNOSIS — H35371 Puckering of macula, right eye: Secondary | ICD-10-CM

## 2021-06-07 DIAGNOSIS — H35372 Puckering of macula, left eye: Secondary | ICD-10-CM | POA: Diagnosis not present

## 2021-06-07 NOTE — Assessment & Plan Note (Signed)
No signs of wet AMD

## 2021-06-07 NOTE — Assessment & Plan Note (Signed)
Minor none foveal distorting

## 2021-06-07 NOTE — Progress Notes (Signed)
06/07/2021     CHIEF COMPLAINT Patient presents for  Chief Complaint  Patient presents with   Cystoid Macular Edema      HISTORY OF PRESENT ILLNESS: [name] is a 76 y.o. female who presents to the clinic today for:   HPI   History of CME OD, no interval change in vision, Last edited by Hurman Horn, MD on 06/07/2021 11:07 AM.      Referring physician: Glenda Chroman, MD [place],  [place] [id number]  HISTORICAL INFORMATION:   Selected notes from the [place]: No current outpatient medications on file. (Ophthalmic Drugs)   No current facility-administered medications for this visit. (Ophthalmic Drugs)   Current Outpatient Medications (Other)  Medication Sig   aspirin 81 MG tablet Take 81 mg by mouth daily.   Magnesium Oxide (MAG-OX PO) Take by mouth daily.    Omega-3 Fatty Acids (FISH OIL PO) 1 teaspoon by mouth daily   vitamin C (ASCORBIC ACID) 500 MG tablet Take 500 mg by mouth daily.   Zinc Sulfate (ZINC 15 PO) Take by mouth daily.    No current facility-administered medications for this visit. (Other)      REVIEW OF SYSTEMS: ROS   Negative for: Constitutional, Gastrointestinal, Neurological, Skin, Genitourinary, Musculoskeletal, HENT, Endocrine, Cardiovascular, Eyes, Respiratory, Psychiatric, Allergic/Imm, Heme/Lymph Last edited by Hurman Horn, MD on 06/07/2021 11:04 AM.       ALLERGIES Allergies  Allergen Reactions   Lisinopril     cough   Codeine Other (See Comments)    Makes "jittery"    PAST MEDICAL HISTORY Past Medical History:  Diagnosis Date   Bladder cancer ([place])    Coronary atherosclerosis    Mild by heart catheterization August 2015   Essential hypertension    Osteoporosis    Vertigo    Past Surgical History:  Procedure Laterality Date   ABDOMINAL HYSTERECTOMY     CATARACT EXTRACTION W/PHACO Left 10/30/2012   Procedure: CATARACT EXTRACTION PHACO AND INTRAOCULAR LENS  PLACEMENT ([place]);  Surgeon: Elta Guadeloupe T. Gershon Crane, MD;  Location: [facility];  Service: Ophthalmology;  Laterality: Left;  CDE:6.00   LEFT HEART CATHETERIZATION WITH CORONARY ANGIOGRAM N/A 12/04/2013   Procedure: LEFT HEART CATHETERIZATION WITH CORONARY ANGIOGRAM;  Surgeon: Burnell Blanks, MD;  Location: [facility] CATH LAB;  Service: Cardiovascular;  Laterality: N/A;   RETINAL DETACHMENT SURGERY Right    TRANSURETHRAL RESECTION OF BLADDER  1999?   Dr. Maryland Pink, [place]    FAMILY HISTORY Family History  Problem Relation Age of Onset   Heart attack Father        Age 53   Dementia Mother     SOCIAL HISTORY Social History   Tobacco Use   Smoking status: Former    Types: Cigarettes    Quit date: 11/20/2003    Years since quitting: 17.5   Smokeless tobacco: Never   Tobacco comments:    quit about 10 years ago  Substance Use Topics   Alcohol use: No    Alcohol/week: 0.0 standard drinks   Drug use: No         OPHTHALMIC EXAM:  Base Eye Exam     Visual Acuity (ETDRS)       Right Left   Dist [place] 20/150 20/20 -1   Dist ph [place] 20/70 -2          Tonometry (Tonopen, 11:05 AM)       Right Left  Pressure 16 12         Dilation     Both eyes: 1.0% Mydriacyl, 2.5% Phenylephrine @ 11:05 AM           Slit Lamp and Fundus Exam     External Exam       Right Left   External Normal Normal         Slit Lamp Exam       Right Left   Lids/Lashes Normal Normal   Conjunctiva/Sclera White and quiet White and quiet   Cornea Clear Clear   Anterior Chamber Deep and quiet Deep and quiet   Iris Round and reactive Round and reactive   Lens Centered posterior chamber intraocular lens Centered posterior chamber intraocular lens   Anterior Vitreous Normal Normal         Fundus Exam       Right Left   Posterior Vitreous Clear, avitric Posterior vitreous detachment   Disc Normal Normal   C/D Ratio 0.65 0.35   Macula Epiretinal membrane, Hard drusen Epiretinal membrane, Hard  drusen   Vessels Normal Normal   Periphery Good scleral buckle, good cryopexy, no new retinal breaks No retinal breaks left eye            IMAGING AND PROCEDURES  Imaging and Procedures for 06/07/21  OCT, Retina - OU - Both Eyes       Right Eye Quality was good. Scan locations included subfoveal. Central Foveal Thickness: 316. Findings include cystoid macular edema, epiretinal membrane, abnormal foveal contour, no SRF.   Left Eye Central Foveal Thickness: 349. Progression has been stable. Findings include epiretinal membrane, retinal drusen , no SRF.   Notes Minor inner retinal perifoveal CME OD decreased over the last several years years ago with mild epiretinal membrane.  No signs of CNVM OD.  OS, similarly moderate ARMD, no CNVM, mild epiretinal membrane with no overt clinical CME     Color Fundus Photography Optos - OU - Both Eyes       Right Eye Progression has no prior data. Disc findings include normal observations. Macula : retinal pigment epithelium abnormalities, drusen.   Left Eye Progression has no prior data. Disc findings include normal observations. Macula : drusen.   Notes OD, status post complex retinal detachment repair years past, stable, minor epiretinal membrane no topographic distortion to the macula and no clinical signs on color fundus photography of CNVM.  OS with mild to moderate ARMD as well.             ASSESSMENT/PLAN:  Intermediate stage nonexudative age-related macular degeneration of both eyes No signs of wet AMD  Right epiretinal membrane Minor none foveal distorting  Left epiretinal membrane Mild to moderate none foveal distorting  Cystoid macular edema of right eye Resolved from the past     ICD-10-CM   1. Left epiretinal membrane  H35.372 OCT, Retina - OU - Both Eyes    Color Fundus Photography Optos - OU - Both Eyes    2. Right epiretinal membrane  H35.371 OCT, Retina - OU - Both Eyes    Color Fundus Photography  Optos - OU - Both Eyes    3. Intermediate stage nonexudative age-related macular degeneration of both eyes  H35.3132 Color Fundus Photography Optos - OU - Both Eyes    4. Cystoid macular edema of right eye  H35.351       1.  OU, minor epiretinal membrane, OD with no impact on acuity continue to observe  OU  2.  Mild ARMD, intermediate, no sign of CNVM  3.  Ophthalmic Meds Ordered this visit:  No orders of the defined types were placed in this encounter.      Return in about 1 year (around 06/07/2022) for DILATE OU, COLOR FP, OCT.  There are no Patient Instructions on file for this visit.   Explained the diagnoses, plan, and follow up with the patient and they expressed understanding.  Patient expressed understanding of the importance of proper follow up care.   Clent Demark Pebble Botkin M.D. Diseases & Surgery of the Retina and Vitreous [facility]ina [facility] Diabetic [place] 06/07/21     Abbreviations: M myopia (nearsighted); A astigmatism; H hyperopia (farsighted); P presbyopia; Mrx spectacle prescription;  CTL contact lenses; OD right eye; OS left eye; OU both eyes  XT exotropia; ET esotropia; PEK punctate epithelial keratitis; PEE punctate epithelial erosions; DES dry eye syndrome; MGD meibomian gland dysfunction; ATs artificial tears; PFAT's preservative free artificial tears; [place] nuclear sclerotic cataract; PSC posterior subcapsular cataract; ERM epi-retinal membrane; PVD posterior vitreous detachment; RD retinal detachment; DM diabetes mellitus; DR diabetic retinopathy; NPDR non-proliferative diabetic retinopathy; PDR proliferative diabetic retinopathy; CSME clinically significant macular edema; DME diabetic macular edema; dbh dot blot hemorrhages; CWS cotton wool spot; POAG primary open angle glaucoma; C/D cup-to-disc ratio; HVF humphrey visual field; GVF goldmann visual field; OCT optical coherence tomography; IOP intraocular pressure; BRVO Branch retinal vein occlusion; CRVO central retinal  vein occlusion; CRAO central retinal artery occlusion; BRAO branch retinal artery occlusion; RT retinal tear; SB scleral buckle; PPV pars plana vitrectomy; VH Vitreous hemorrhage; PRP panretinal laser photocoagulation; IVK intravitreal kenalog; VMT vitreomacular traction; MH Macular hole;  NVD neovascularization of the disc; NVE neovascularization elsewhere; AREDS age related eye disease study; ARMD age related macular degeneration; POAG primary open angle glaucoma; EBMD epithelial/anterior basement membrane dystrophy; ACIOL anterior chamber intraocular lens; IOL intraocular lens; PCIOL posterior chamber intraocular lens; Phaco/IOL phacoemulsification with intraocular lens placement; [place] photorefractive keratectomy; LASIK laser assisted in situ keratomileusis; HTN hypertension; DM diabetes mellitus; COPD chronic obstructive pulmonary disease

## 2021-06-07 NOTE — Assessment & Plan Note (Signed)
Mild to moderate none foveal distorting

## 2021-06-07 NOTE — Assessment & Plan Note (Signed)
Resolved from the past

## 2021-06-09 DIAGNOSIS — I700 Atherosclerosis of aorta: Secondary | ICD-10-CM | POA: Diagnosis not present

## 2021-06-09 DIAGNOSIS — J329 Chronic sinusitis, unspecified: Secondary | ICD-10-CM | POA: Diagnosis not present

## 2021-06-09 DIAGNOSIS — Z299 Encounter for prophylactic measures, unspecified: Secondary | ICD-10-CM | POA: Diagnosis not present

## 2021-06-09 DIAGNOSIS — D692 Other nonthrombocytopenic purpura: Secondary | ICD-10-CM | POA: Diagnosis not present

## 2021-06-09 DIAGNOSIS — N1831 Chronic kidney disease, stage 3a: Secondary | ICD-10-CM | POA: Diagnosis not present

## 2021-07-29 DIAGNOSIS — Z789 Other specified health status: Secondary | ICD-10-CM | POA: Diagnosis not present

## 2021-07-29 DIAGNOSIS — R0989 Other specified symptoms and signs involving the circulatory and respiratory systems: Secondary | ICD-10-CM | POA: Diagnosis not present

## 2021-07-29 DIAGNOSIS — Z299 Encounter for prophylactic measures, unspecified: Secondary | ICD-10-CM | POA: Diagnosis not present

## 2021-07-29 DIAGNOSIS — I10 Essential (primary) hypertension: Secondary | ICD-10-CM | POA: Diagnosis not present

## 2021-07-29 DIAGNOSIS — R350 Frequency of micturition: Secondary | ICD-10-CM | POA: Diagnosis not present

## 2021-07-29 DIAGNOSIS — N390 Urinary tract infection, site not specified: Secondary | ICD-10-CM | POA: Diagnosis not present

## 2021-08-12 DIAGNOSIS — Z299 Encounter for prophylactic measures, unspecified: Secondary | ICD-10-CM | POA: Diagnosis not present

## 2021-08-12 DIAGNOSIS — R509 Fever, unspecified: Secondary | ICD-10-CM | POA: Diagnosis not present

## 2021-08-12 DIAGNOSIS — J069 Acute upper respiratory infection, unspecified: Secondary | ICD-10-CM | POA: Diagnosis not present

## 2021-08-12 DIAGNOSIS — R5383 Other fatigue: Secondary | ICD-10-CM | POA: Diagnosis not present

## 2021-08-16 DIAGNOSIS — J209 Acute bronchitis, unspecified: Secondary | ICD-10-CM | POA: Diagnosis not present

## 2021-08-30 DIAGNOSIS — R0982 Postnasal drip: Secondary | ICD-10-CM | POA: Diagnosis not present

## 2021-08-30 DIAGNOSIS — R051 Acute cough: Secondary | ICD-10-CM | POA: Diagnosis not present

## 2021-08-30 DIAGNOSIS — H6502 Acute serous otitis media, left ear: Secondary | ICD-10-CM | POA: Diagnosis not present

## 2021-09-13 DIAGNOSIS — M810 Age-related osteoporosis without current pathological fracture: Secondary | ICD-10-CM | POA: Diagnosis not present

## 2021-10-20 DIAGNOSIS — Z789 Other specified health status: Secondary | ICD-10-CM | POA: Diagnosis not present

## 2021-10-20 DIAGNOSIS — M109 Gout, unspecified: Secondary | ICD-10-CM | POA: Diagnosis not present

## 2021-10-20 DIAGNOSIS — M10041 Idiopathic gout, right hand: Secondary | ICD-10-CM | POA: Diagnosis not present

## 2021-10-20 DIAGNOSIS — I10 Essential (primary) hypertension: Secondary | ICD-10-CM | POA: Diagnosis not present

## 2021-10-20 DIAGNOSIS — Z299 Encounter for prophylactic measures, unspecified: Secondary | ICD-10-CM | POA: Diagnosis not present

## 2021-12-08 DIAGNOSIS — I10 Essential (primary) hypertension: Secondary | ICD-10-CM | POA: Diagnosis not present

## 2021-12-08 DIAGNOSIS — N1831 Chronic kidney disease, stage 3a: Secondary | ICD-10-CM | POA: Diagnosis not present

## 2021-12-08 DIAGNOSIS — Z6822 Body mass index (BMI) 22.0-22.9, adult: Secondary | ICD-10-CM | POA: Diagnosis not present

## 2021-12-08 DIAGNOSIS — Z789 Other specified health status: Secondary | ICD-10-CM | POA: Diagnosis not present

## 2021-12-08 DIAGNOSIS — Z0000 Encounter for general adult medical examination without abnormal findings: Secondary | ICD-10-CM | POA: Diagnosis not present

## 2021-12-08 DIAGNOSIS — Z7189 Other specified counseling: Secondary | ICD-10-CM | POA: Diagnosis not present

## 2021-12-08 DIAGNOSIS — Z299 Encounter for prophylactic measures, unspecified: Secondary | ICD-10-CM | POA: Diagnosis not present

## 2021-12-08 DIAGNOSIS — I700 Atherosclerosis of aorta: Secondary | ICD-10-CM | POA: Diagnosis not present

## 2022-01-02 DIAGNOSIS — R350 Frequency of micturition: Secondary | ICD-10-CM | POA: Diagnosis not present

## 2022-01-02 DIAGNOSIS — N390 Urinary tract infection, site not specified: Secondary | ICD-10-CM | POA: Diagnosis not present

## 2022-01-04 DIAGNOSIS — N390 Urinary tract infection, site not specified: Secondary | ICD-10-CM | POA: Diagnosis not present

## 2022-01-04 DIAGNOSIS — R350 Frequency of micturition: Secondary | ICD-10-CM | POA: Diagnosis not present

## 2022-01-19 DIAGNOSIS — Z299 Encounter for prophylactic measures, unspecified: Secondary | ICD-10-CM | POA: Diagnosis not present

## 2022-01-19 DIAGNOSIS — Z6823 Body mass index (BMI) 23.0-23.9, adult: Secondary | ICD-10-CM | POA: Diagnosis not present

## 2022-01-19 DIAGNOSIS — R5383 Other fatigue: Secondary | ICD-10-CM | POA: Diagnosis not present

## 2022-01-19 DIAGNOSIS — E7800 Pure hypercholesterolemia, unspecified: Secondary | ICD-10-CM | POA: Diagnosis not present

## 2022-01-19 DIAGNOSIS — Z23 Encounter for immunization: Secondary | ICD-10-CM | POA: Diagnosis not present

## 2022-01-19 DIAGNOSIS — Z789 Other specified health status: Secondary | ICD-10-CM | POA: Diagnosis not present

## 2022-01-19 DIAGNOSIS — Z79899 Other long term (current) drug therapy: Secondary | ICD-10-CM | POA: Diagnosis not present

## 2022-01-19 DIAGNOSIS — I10 Essential (primary) hypertension: Secondary | ICD-10-CM | POA: Diagnosis not present

## 2022-01-19 DIAGNOSIS — M810 Age-related osteoporosis without current pathological fracture: Secondary | ICD-10-CM | POA: Diagnosis not present

## 2022-01-19 DIAGNOSIS — Z0000 Encounter for general adult medical examination without abnormal findings: Secondary | ICD-10-CM | POA: Diagnosis not present

## 2022-02-11 DIAGNOSIS — M859 Disorder of bone density and structure, unspecified: Secondary | ICD-10-CM | POA: Diagnosis not present

## 2022-02-11 DIAGNOSIS — M818 Other osteoporosis without current pathological fracture: Secondary | ICD-10-CM | POA: Diagnosis not present

## 2022-02-11 DIAGNOSIS — Z79899 Other long term (current) drug therapy: Secondary | ICD-10-CM | POA: Diagnosis not present

## 2022-02-14 NOTE — Progress Notes (Unsigned)
Cardiology Office Note  Date: 02/15/2022   ID: [name], DOB 06/24/1945, MRN [id number]  PCP:  Glenda Chroman, MD  Cardiologist:  Rozann Lesches, MD Electrophysiologist:  None   Chief Complaint  Patient presents with   Cardiac follow-up    History of Present Illness: [name] is a 75 y.o. female last seen in July 2022.  She is here for a routine visit.  Reports no exertional angina, stable NYHA class I dyspnea.  She enjoys dancing at least 3 days a week.  Has not noticed any specific change in stamina.  No sudden unexplained syncope.  I reviewed her ECG today which shows sinus bradycardia in the 40s, low voltage and nonspecific ST changes.  She is asymptomatic and has a fairly longstanding history of bradycardia.  I reviewed her current medications.  She remains on low-dose aspirin.  Requesting interval lab work from Dr. Woody Seller for review.  We have discussed possibility of low-dose statin therapy with history of atherosclerosis, she has preferred to hold off on this.  Past Medical History:  Diagnosis Date   Bladder cancer ([place])    Coronary atherosclerosis    Mild by heart catheterization August 2015   Essential hypertension    Osteoporosis    Vertigo     Past Surgical History:  Procedure Laterality Date   ABDOMINAL HYSTERECTOMY     CATARACT EXTRACTION W/PHACO Left 10/30/2012   Procedure: CATARACT EXTRACTION PHACO AND INTRAOCULAR LENS PLACEMENT ([place]);  Surgeon: Elta Guadeloupe T. Gershon Crane, MD;  Location: [facility];  Service: Ophthalmology;  Laterality: Left;  CDE:6.00   LEFT HEART CATHETERIZATION WITH CORONARY ANGIOGRAM N/A 12/04/2013   Procedure: LEFT HEART CATHETERIZATION WITH CORONARY ANGIOGRAM;  Surgeon: Burnell Blanks, MD;  Location: [facility] CATH LAB;  Service: Cardiovascular;  Laterality: N/A;   RETINAL DETACHMENT SURGERY Right    TRANSURETHRAL RESECTION OF BLADDER  1999?   Dr. Maryland Pink, [facility]    Current Outpatient Medications  Medication Sig Dispense Refill    aspirin 81 MG tablet Take 81 mg by mouth daily.     Magnesium Oxide (MAG-OX PO) Take by mouth daily.      vitamin C (ASCORBIC ACID) 500 MG tablet Take 500 mg by mouth daily.     Zinc Sulfate (ZINC 15 PO) Take by mouth daily.      No current facility-administered medications for this visit.   Allergies:  Lisinopril and Codeine   ROS: No orthopnea or PND.  Physical Exam: VS:  BP (!) 144/80   Pulse (!) 45   Ht '5\' 2"'$  (1.575 m)   Wt 120 lb 6.4 oz (54.6 kg)   SpO2 99%   BMI 22.02 kg/m , BMI Body mass index is 22.02 kg/m.  Wt Readings from Last 3 Encounters:  02/15/22 120 lb 6.4 oz (54.6 kg)  11/02/20 125 lb (56.7 kg)  05/03/19 126 lb 12.8 oz (57.5 kg)    General: Patient appears comfortable at rest. HEENT: Conjunctiva and lids normal, oropharynx clear. Neck: Supple, no elevated JVP or carotid bruits, no thyromegaly. Lungs: Clear to auscultation, nonlabored breathing at rest. Cardiac: Regular rate and rhythm, no S3 or significant systolic murmur. Abdomen: Soft, nontender, bowel sounds present. Extremities: No pitting edema.  ECG:  An ECG dated 11/02/2020 was personally reviewed today and demonstrated:  Sinus bradycardia with leftward axis and nonspecific ST changes.  Recent Labwork:  June 2021: Potassium 3.4, BUN 14, creatinine 0.81  Other Studies Reviewed Today:  No interval cardiac testing for review today.  Assessment and Plan:  1.  Asymptomatic mild coronary atherosclerosis based on prior testing, no exertional symptoms or substantial functional limitation at this time.  She dances regularly for exercise.  Continue aspirin 81 mg daily, has been hesitant to use statin therapy.  Requesting interval lab work from PCP for review.  2.  Sinus bradycardia, asymptomatic.  I reviewed her ECG.  No history of sudden unexplained syncope or functional limitation.  3.  Whitecoat hypertension.  Checks blood pressure at home and with largely normal numbers.  Continue observation and  follow-up with PCP.  Medication Adjustments/Labs and Tests Ordered: Current medicines are reviewed at length with the patient today.  Concerns regarding medicines are outlined above.   Tests Ordered: Orders Placed This Encounter  Procedures   EKG 12-Lead    Medication Changes: No orders of the defined types were placed in this encounter.   Disposition:  Follow up  1 year.  Signed, Satira Sark, MD, [facility] 02/15/2022 9:40 AM    [place] [facility] [place]. [address], [place], [place] [address] Phone: [phone]; Fax: [phone]

## 2022-02-15 ENCOUNTER — Encounter: Payer: Self-pay | Admitting: Cardiology

## 2022-02-15 ENCOUNTER — Ambulatory Visit: Payer: Medicare Other | Attending: Cardiology | Admitting: Cardiology

## 2022-02-15 VITALS — BP 144/80 | HR 45 | Ht 62.0 in | Wt 120.4 lb

## 2022-02-15 DIAGNOSIS — I10 Essential (primary) hypertension: Secondary | ICD-10-CM

## 2022-02-15 DIAGNOSIS — I2510 Atherosclerotic heart disease of native coronary artery without angina pectoris: Secondary | ICD-10-CM | POA: Diagnosis not present

## 2022-02-15 DIAGNOSIS — R001 Bradycardia, unspecified: Secondary | ICD-10-CM | POA: Diagnosis not present

## 2022-02-15 NOTE — Patient Instructions (Signed)
Medication Instructions:  Your physician recommends that you continue on your current medications as directed. Please refer to the Current Medication list given to you today.   Labwork: None today  Testing/Procedures: None today  Follow-Up: 1 year  Any Other Special Instructions Will Be Listed Below (If Applicable).  If you need a refill on your cardiac medications before your next appointment, please call your pharmacy.  

## 2022-03-08 ENCOUNTER — Other Ambulatory Visit: Payer: Self-pay | Admitting: Internal Medicine

## 2022-03-08 DIAGNOSIS — Z1231 Encounter for screening mammogram for malignant neoplasm of breast: Secondary | ICD-10-CM

## 2022-03-26 DIAGNOSIS — Z6821 Body mass index (BMI) 21.0-21.9, adult: Secondary | ICD-10-CM | POA: Diagnosis not present

## 2022-03-26 DIAGNOSIS — M25562 Pain in left knee: Secondary | ICD-10-CM | POA: Diagnosis not present

## 2022-03-26 DIAGNOSIS — R030 Elevated blood-pressure reading, without diagnosis of hypertension: Secondary | ICD-10-CM | POA: Diagnosis not present

## 2022-03-29 DIAGNOSIS — Z299 Encounter for prophylactic measures, unspecified: Secondary | ICD-10-CM | POA: Diagnosis not present

## 2022-03-29 DIAGNOSIS — I10 Essential (primary) hypertension: Secondary | ICD-10-CM | POA: Diagnosis not present

## 2022-03-29 DIAGNOSIS — Z789 Other specified health status: Secondary | ICD-10-CM | POA: Diagnosis not present

## 2022-03-29 DIAGNOSIS — M25562 Pain in left knee: Secondary | ICD-10-CM | POA: Diagnosis not present

## 2022-03-30 ENCOUNTER — Inpatient Hospital Stay: Admission: RE | Admit: 2022-03-30 | Payer: Medicare Other | Source: Ambulatory Visit

## 2022-03-30 DIAGNOSIS — M1712 Unilateral primary osteoarthritis, left knee: Secondary | ICD-10-CM | POA: Diagnosis not present

## 2022-04-08 DIAGNOSIS — M810 Age-related osteoporosis without current pathological fracture: Secondary | ICD-10-CM | POA: Diagnosis not present

## 2022-04-18 DIAGNOSIS — M4726 Other spondylosis with radiculopathy, lumbar region: Secondary | ICD-10-CM | POA: Diagnosis not present

## 2022-04-18 DIAGNOSIS — M25562 Pain in left knee: Secondary | ICD-10-CM | POA: Diagnosis not present

## 2022-04-18 DIAGNOSIS — M1712 Unilateral primary osteoarthritis, left knee: Secondary | ICD-10-CM | POA: Diagnosis not present

## 2022-04-20 ENCOUNTER — Other Ambulatory Visit: Payer: Self-pay | Admitting: Sports Medicine

## 2022-04-20 DIAGNOSIS — M4726 Other spondylosis with radiculopathy, lumbar region: Secondary | ICD-10-CM

## 2022-04-20 DIAGNOSIS — M545 Low back pain, unspecified: Secondary | ICD-10-CM

## 2022-04-26 DIAGNOSIS — M4726 Other spondylosis with radiculopathy, lumbar region: Secondary | ICD-10-CM | POA: Diagnosis not present

## 2022-04-26 DIAGNOSIS — M25562 Pain in left knee: Secondary | ICD-10-CM | POA: Diagnosis not present

## 2022-04-26 DIAGNOSIS — M1712 Unilateral primary osteoarthritis, left knee: Secondary | ICD-10-CM | POA: Diagnosis not present

## 2022-04-26 DIAGNOSIS — M47896 Other spondylosis, lumbar region: Secondary | ICD-10-CM | POA: Diagnosis not present

## 2022-04-29 DIAGNOSIS — M1712 Unilateral primary osteoarthritis, left knee: Secondary | ICD-10-CM | POA: Diagnosis not present

## 2022-04-29 DIAGNOSIS — M4726 Other spondylosis with radiculopathy, lumbar region: Secondary | ICD-10-CM | POA: Diagnosis not present

## 2022-04-29 DIAGNOSIS — M47896 Other spondylosis, lumbar region: Secondary | ICD-10-CM | POA: Diagnosis not present

## 2022-04-29 DIAGNOSIS — M25562 Pain in left knee: Secondary | ICD-10-CM | POA: Diagnosis not present

## 2022-05-02 ENCOUNTER — Other Ambulatory Visit: Payer: Self-pay | Admitting: Sports Medicine

## 2022-05-02 DIAGNOSIS — M545 Low back pain, unspecified: Secondary | ICD-10-CM

## 2022-05-02 DIAGNOSIS — M25562 Pain in left knee: Secondary | ICD-10-CM

## 2022-05-02 DIAGNOSIS — M4726 Other spondylosis with radiculopathy, lumbar region: Secondary | ICD-10-CM

## 2022-05-03 DIAGNOSIS — M47896 Other spondylosis, lumbar region: Secondary | ICD-10-CM | POA: Diagnosis not present

## 2022-05-03 DIAGNOSIS — M1712 Unilateral primary osteoarthritis, left knee: Secondary | ICD-10-CM | POA: Diagnosis not present

## 2022-05-03 DIAGNOSIS — M4726 Other spondylosis with radiculopathy, lumbar region: Secondary | ICD-10-CM | POA: Diagnosis not present

## 2022-05-03 DIAGNOSIS — M25562 Pain in left knee: Secondary | ICD-10-CM | POA: Diagnosis not present

## 2022-05-05 DIAGNOSIS — M47896 Other spondylosis, lumbar region: Secondary | ICD-10-CM | POA: Diagnosis not present

## 2022-05-05 DIAGNOSIS — M4726 Other spondylosis with radiculopathy, lumbar region: Secondary | ICD-10-CM | POA: Diagnosis not present

## 2022-05-05 DIAGNOSIS — M1712 Unilateral primary osteoarthritis, left knee: Secondary | ICD-10-CM | POA: Diagnosis not present

## 2022-05-05 DIAGNOSIS — M25562 Pain in left knee: Secondary | ICD-10-CM | POA: Diagnosis not present

## 2022-05-08 ENCOUNTER — Ambulatory Visit
Admission: RE | Admit: 2022-05-08 | Discharge: 2022-05-08 | Disposition: A | Discharge status: Home / Self Care | Payer: Medicare Other | Source: Ambulatory Visit | Attending: Sports Medicine | Admitting: Sports Medicine

## 2022-05-08 DIAGNOSIS — M4316 Spondylolisthesis, lumbar region: Secondary | ICD-10-CM | POA: Diagnosis not present

## 2022-05-08 DIAGNOSIS — M4726 Other spondylosis with radiculopathy, lumbar region: Secondary | ICD-10-CM

## 2022-05-08 DIAGNOSIS — M545 Low back pain, unspecified: Secondary | ICD-10-CM | POA: Diagnosis not present

## 2022-05-08 DIAGNOSIS — M48061 Spinal stenosis, lumbar region without neurogenic claudication: Secondary | ICD-10-CM | POA: Diagnosis not present

## 2022-05-10 DIAGNOSIS — M1712 Unilateral primary osteoarthritis, left knee: Secondary | ICD-10-CM | POA: Diagnosis not present

## 2022-05-10 DIAGNOSIS — M47896 Other spondylosis, lumbar region: Secondary | ICD-10-CM | POA: Diagnosis not present

## 2022-05-10 DIAGNOSIS — M4726 Other spondylosis with radiculopathy, lumbar region: Secondary | ICD-10-CM | POA: Diagnosis not present

## 2022-05-10 DIAGNOSIS — M25562 Pain in left knee: Secondary | ICD-10-CM | POA: Diagnosis not present

## 2022-05-12 DIAGNOSIS — M4726 Other spondylosis with radiculopathy, lumbar region: Secondary | ICD-10-CM | POA: Diagnosis not present

## 2022-05-12 DIAGNOSIS — M47896 Other spondylosis, lumbar region: Secondary | ICD-10-CM | POA: Diagnosis not present

## 2022-05-12 DIAGNOSIS — M1712 Unilateral primary osteoarthritis, left knee: Secondary | ICD-10-CM | POA: Diagnosis not present

## 2022-05-12 DIAGNOSIS — M25562 Pain in left knee: Secondary | ICD-10-CM | POA: Diagnosis not present

## 2022-05-14 ENCOUNTER — Ambulatory Visit
Admission: RE | Admit: 2022-05-14 | Discharge: 2022-05-14 | Disposition: A | Discharge status: Home / Self Care | Payer: Medicare Other | Source: Ambulatory Visit | Attending: Sports Medicine | Admitting: Sports Medicine

## 2022-05-14 DIAGNOSIS — M25562 Pain in left knee: Secondary | ICD-10-CM | POA: Diagnosis not present

## 2022-05-16 DIAGNOSIS — M25562 Pain in left knee: Secondary | ICD-10-CM | POA: Diagnosis not present

## 2022-05-17 DIAGNOSIS — M4726 Other spondylosis with radiculopathy, lumbar region: Secondary | ICD-10-CM | POA: Diagnosis not present

## 2022-05-17 DIAGNOSIS — M25562 Pain in left knee: Secondary | ICD-10-CM | POA: Diagnosis not present

## 2022-05-17 DIAGNOSIS — M47896 Other spondylosis, lumbar region: Secondary | ICD-10-CM | POA: Diagnosis not present

## 2022-05-17 DIAGNOSIS — M1712 Unilateral primary osteoarthritis, left knee: Secondary | ICD-10-CM | POA: Diagnosis not present

## 2022-05-19 DIAGNOSIS — M4726 Other spondylosis with radiculopathy, lumbar region: Secondary | ICD-10-CM | POA: Diagnosis not present

## 2022-05-19 DIAGNOSIS — M47896 Other spondylosis, lumbar region: Secondary | ICD-10-CM | POA: Diagnosis not present

## 2022-05-19 DIAGNOSIS — M1712 Unilateral primary osteoarthritis, left knee: Secondary | ICD-10-CM | POA: Diagnosis not present

## 2022-05-19 DIAGNOSIS — M25562 Pain in left knee: Secondary | ICD-10-CM | POA: Diagnosis not present

## 2022-05-24 DIAGNOSIS — M25562 Pain in left knee: Secondary | ICD-10-CM | POA: Diagnosis not present

## 2022-05-24 DIAGNOSIS — M47896 Other spondylosis, lumbar region: Secondary | ICD-10-CM | POA: Diagnosis not present

## 2022-05-24 DIAGNOSIS — M4726 Other spondylosis with radiculopathy, lumbar region: Secondary | ICD-10-CM | POA: Diagnosis not present

## 2022-05-24 DIAGNOSIS — M1712 Unilateral primary osteoarthritis, left knee: Secondary | ICD-10-CM | POA: Diagnosis not present

## 2022-05-26 DIAGNOSIS — M5416 Radiculopathy, lumbar region: Secondary | ICD-10-CM | POA: Diagnosis not present

## 2022-05-31 DIAGNOSIS — M5416 Radiculopathy, lumbar region: Secondary | ICD-10-CM | POA: Diagnosis not present

## 2022-06-03 DIAGNOSIS — M25562 Pain in left knee: Secondary | ICD-10-CM | POA: Diagnosis not present

## 2022-06-07 ENCOUNTER — Encounter (INDEPENDENT_AMBULATORY_CARE_PROVIDER_SITE_OTHER): Payer: Medicare Other | Admitting: Ophthalmology

## 2022-06-29 DIAGNOSIS — M5416 Radiculopathy, lumbar region: Secondary | ICD-10-CM | POA: Diagnosis not present

## 2022-07-05 ENCOUNTER — Ambulatory Visit
Admission: RE | Admit: 2022-07-05 | Discharge: 2022-07-05 | Disposition: A | Discharge status: Home / Self Care | Payer: Medicare Other | Source: Ambulatory Visit | Attending: Internal Medicine | Admitting: Internal Medicine

## 2022-07-05 DIAGNOSIS — Z1231 Encounter for screening mammogram for malignant neoplasm of breast: Secondary | ICD-10-CM | POA: Diagnosis not present

## 2022-07-12 DIAGNOSIS — N1831 Chronic kidney disease, stage 3a: Secondary | ICD-10-CM | POA: Diagnosis not present

## 2022-07-12 DIAGNOSIS — I700 Atherosclerosis of aorta: Secondary | ICD-10-CM | POA: Diagnosis not present

## 2022-07-12 DIAGNOSIS — J329 Chronic sinusitis, unspecified: Secondary | ICD-10-CM | POA: Diagnosis not present

## 2022-07-12 DIAGNOSIS — Z299 Encounter for prophylactic measures, unspecified: Secondary | ICD-10-CM | POA: Diagnosis not present

## 2022-07-23 DIAGNOSIS — S92354A Nondisplaced fracture of fifth metatarsal bone, right foot, initial encounter for closed fracture: Secondary | ICD-10-CM | POA: Diagnosis not present

## 2022-07-23 DIAGNOSIS — M79671 Pain in right foot: Secondary | ICD-10-CM | POA: Diagnosis not present

## 2022-07-23 DIAGNOSIS — Z6822 Body mass index (BMI) 22.0-22.9, adult: Secondary | ICD-10-CM | POA: Diagnosis not present

## 2022-07-23 DIAGNOSIS — I10 Essential (primary) hypertension: Secondary | ICD-10-CM | POA: Diagnosis not present

## 2022-07-25 DIAGNOSIS — S92354D Nondisplaced fracture of fifth metatarsal bone, right foot, subsequent encounter for fracture with routine healing: Secondary | ICD-10-CM | POA: Diagnosis not present

## 2022-07-25 DIAGNOSIS — M810 Age-related osteoporosis without current pathological fracture: Secondary | ICD-10-CM | POA: Diagnosis not present

## 2022-07-30 DIAGNOSIS — R030 Elevated blood-pressure reading, without diagnosis of hypertension: Secondary | ICD-10-CM | POA: Diagnosis not present

## 2022-07-30 DIAGNOSIS — R300 Dysuria: Secondary | ICD-10-CM | POA: Diagnosis not present

## 2022-07-30 DIAGNOSIS — Z6822 Body mass index (BMI) 22.0-22.9, adult: Secondary | ICD-10-CM | POA: Diagnosis not present

## 2022-08-18 DIAGNOSIS — H33021 Retinal detachment with multiple breaks, right eye: Secondary | ICD-10-CM | POA: Diagnosis not present

## 2022-08-18 DIAGNOSIS — Z961 Presence of intraocular lens: Secondary | ICD-10-CM | POA: Diagnosis not present

## 2022-08-22 DIAGNOSIS — S92354D Nondisplaced fracture of fifth metatarsal bone, right foot, subsequent encounter for fracture with routine healing: Secondary | ICD-10-CM | POA: Diagnosis not present

## 2022-08-22 DIAGNOSIS — M810 Age-related osteoporosis without current pathological fracture: Secondary | ICD-10-CM | POA: Diagnosis not present

## 2022-09-12 DIAGNOSIS — M810 Age-related osteoporosis without current pathological fracture: Secondary | ICD-10-CM | POA: Diagnosis not present

## 2022-09-12 DIAGNOSIS — S92354D Nondisplaced fracture of fifth metatarsal bone, right foot, subsequent encounter for fracture with routine healing: Secondary | ICD-10-CM | POA: Diagnosis not present

## 2022-09-15 DIAGNOSIS — M810 Age-related osteoporosis without current pathological fracture: Secondary | ICD-10-CM | POA: Diagnosis not present

## 2022-09-22 DIAGNOSIS — L918 Other hypertrophic disorders of the skin: Secondary | ICD-10-CM | POA: Diagnosis not present

## 2022-09-22 DIAGNOSIS — L820 Inflamed seborrheic keratosis: Secondary | ICD-10-CM | POA: Diagnosis not present

## 2022-09-22 DIAGNOSIS — D225 Melanocytic nevi of trunk: Secondary | ICD-10-CM | POA: Diagnosis not present

## 2022-10-05 DIAGNOSIS — M810 Age-related osteoporosis without current pathological fracture: Secondary | ICD-10-CM | POA: Diagnosis not present

## 2022-10-10 DIAGNOSIS — M25571 Pain in right ankle and joints of right foot: Secondary | ICD-10-CM | POA: Diagnosis not present

## 2022-10-10 DIAGNOSIS — M810 Age-related osteoporosis without current pathological fracture: Secondary | ICD-10-CM | POA: Diagnosis not present

## 2022-10-24 DIAGNOSIS — L820 Inflamed seborrheic keratosis: Secondary | ICD-10-CM | POA: Diagnosis not present

## 2022-11-07 DIAGNOSIS — M79671 Pain in right foot: Secondary | ICD-10-CM | POA: Diagnosis not present

## 2022-11-07 DIAGNOSIS — M1612 Unilateral primary osteoarthritis, left hip: Secondary | ICD-10-CM | POA: Diagnosis not present

## 2022-12-15 DIAGNOSIS — Z0000 Encounter for general adult medical examination without abnormal findings: Secondary | ICD-10-CM | POA: Diagnosis not present

## 2022-12-15 DIAGNOSIS — Z7189 Other specified counseling: Secondary | ICD-10-CM | POA: Diagnosis not present

## 2022-12-15 DIAGNOSIS — Z299 Encounter for prophylactic measures, unspecified: Secondary | ICD-10-CM | POA: Diagnosis not present

## 2022-12-15 DIAGNOSIS — E7800 Pure hypercholesterolemia, unspecified: Secondary | ICD-10-CM | POA: Diagnosis not present

## 2022-12-15 DIAGNOSIS — M810 Age-related osteoporosis without current pathological fracture: Secondary | ICD-10-CM | POA: Diagnosis not present

## 2022-12-15 DIAGNOSIS — R5383 Other fatigue: Secondary | ICD-10-CM | POA: Diagnosis not present

## 2022-12-15 DIAGNOSIS — Z79899 Other long term (current) drug therapy: Secondary | ICD-10-CM | POA: Diagnosis not present

## 2023-01-17 DIAGNOSIS — Z0000 Encounter for general adult medical examination without abnormal findings: Secondary | ICD-10-CM | POA: Diagnosis not present

## 2023-01-17 DIAGNOSIS — I700 Atherosclerosis of aorta: Secondary | ICD-10-CM | POA: Diagnosis not present

## 2023-01-17 DIAGNOSIS — Z299 Encounter for prophylactic measures, unspecified: Secondary | ICD-10-CM | POA: Diagnosis not present

## 2023-01-17 DIAGNOSIS — N1831 Chronic kidney disease, stage 3a: Secondary | ICD-10-CM | POA: Diagnosis not present

## 2023-01-17 DIAGNOSIS — Z23 Encounter for immunization: Secondary | ICD-10-CM | POA: Diagnosis not present

## 2023-01-23 DIAGNOSIS — L820 Inflamed seborrheic keratosis: Secondary | ICD-10-CM | POA: Diagnosis not present

## 2023-01-24 DIAGNOSIS — Z6823 Body mass index (BMI) 23.0-23.9, adult: Secondary | ICD-10-CM | POA: Diagnosis not present

## 2023-01-24 DIAGNOSIS — I10 Essential (primary) hypertension: Secondary | ICD-10-CM | POA: Diagnosis not present

## 2023-01-24 DIAGNOSIS — Z299 Encounter for prophylactic measures, unspecified: Secondary | ICD-10-CM | POA: Diagnosis not present

## 2023-02-01 DIAGNOSIS — N1831 Chronic kidney disease, stage 3a: Secondary | ICD-10-CM | POA: Diagnosis not present

## 2023-02-01 DIAGNOSIS — Z299 Encounter for prophylactic measures, unspecified: Secondary | ICD-10-CM | POA: Diagnosis not present

## 2023-02-01 DIAGNOSIS — I10 Essential (primary) hypertension: Secondary | ICD-10-CM | POA: Diagnosis not present

## 2023-02-01 DIAGNOSIS — I700 Atherosclerosis of aorta: Secondary | ICD-10-CM | POA: Diagnosis not present

## 2023-02-06 DIAGNOSIS — I10 Essential (primary) hypertension: Secondary | ICD-10-CM | POA: Diagnosis not present

## 2023-03-07 DIAGNOSIS — N1831 Chronic kidney disease, stage 3a: Secondary | ICD-10-CM | POA: Diagnosis not present

## 2023-03-07 DIAGNOSIS — I10 Essential (primary) hypertension: Secondary | ICD-10-CM | POA: Diagnosis not present

## 2023-03-07 DIAGNOSIS — I700 Atherosclerosis of aorta: Secondary | ICD-10-CM | POA: Diagnosis not present

## 2023-03-07 DIAGNOSIS — Z299 Encounter for prophylactic measures, unspecified: Secondary | ICD-10-CM | POA: Diagnosis not present

## 2023-03-10 DIAGNOSIS — I10 Essential (primary) hypertension: Secondary | ICD-10-CM | POA: Diagnosis not present

## 2023-04-03 DIAGNOSIS — M810 Age-related osteoporosis without current pathological fracture: Secondary | ICD-10-CM | POA: Diagnosis not present

## 2023-04-10 DIAGNOSIS — I10 Essential (primary) hypertension: Secondary | ICD-10-CM | POA: Diagnosis not present

## 2023-04-10 DIAGNOSIS — Z6824 Body mass index (BMI) 24.0-24.9, adult: Secondary | ICD-10-CM | POA: Diagnosis not present

## 2023-04-10 DIAGNOSIS — Z299 Encounter for prophylactic measures, unspecified: Secondary | ICD-10-CM | POA: Diagnosis not present

## 2023-05-10 DIAGNOSIS — I10 Essential (primary) hypertension: Secondary | ICD-10-CM | POA: Diagnosis not present

## 2023-05-16 DIAGNOSIS — Z6824 Body mass index (BMI) 24.0-24.9, adult: Secondary | ICD-10-CM | POA: Diagnosis not present

## 2023-05-16 DIAGNOSIS — I700 Atherosclerosis of aorta: Secondary | ICD-10-CM | POA: Diagnosis not present

## 2023-05-16 DIAGNOSIS — Z299 Encounter for prophylactic measures, unspecified: Secondary | ICD-10-CM | POA: Diagnosis not present

## 2023-05-16 DIAGNOSIS — I10 Essential (primary) hypertension: Secondary | ICD-10-CM | POA: Diagnosis not present

## 2023-05-16 DIAGNOSIS — N1831 Chronic kidney disease, stage 3a: Secondary | ICD-10-CM | POA: Diagnosis not present

## 2023-06-09 DIAGNOSIS — I10 Essential (primary) hypertension: Secondary | ICD-10-CM | POA: Diagnosis not present

## 2023-07-09 DIAGNOSIS — I10 Essential (primary) hypertension: Secondary | ICD-10-CM | POA: Diagnosis not present

## 2023-07-11 ENCOUNTER — Other Ambulatory Visit: Payer: Self-pay | Admitting: Internal Medicine

## 2023-07-11 ENCOUNTER — Ambulatory Visit
Admission: RE | Admit: 2023-07-11 | Discharge: 2023-07-11 | Disposition: A | Discharge status: Home / Self Care | Source: Ambulatory Visit | Attending: Internal Medicine | Admitting: Internal Medicine

## 2023-07-11 DIAGNOSIS — Z299 Encounter for prophylactic measures, unspecified: Secondary | ICD-10-CM | POA: Diagnosis not present

## 2023-07-11 DIAGNOSIS — Z1231 Encounter for screening mammogram for malignant neoplasm of breast: Secondary | ICD-10-CM

## 2023-07-11 DIAGNOSIS — I700 Atherosclerosis of aorta: Secondary | ICD-10-CM | POA: Diagnosis not present

## 2023-07-11 DIAGNOSIS — N8110 Cystocele, unspecified: Secondary | ICD-10-CM | POA: Diagnosis not present

## 2023-07-11 DIAGNOSIS — N1831 Chronic kidney disease, stage 3a: Secondary | ICD-10-CM | POA: Diagnosis not present

## 2023-07-11 DIAGNOSIS — I10 Essential (primary) hypertension: Secondary | ICD-10-CM | POA: Diagnosis not present

## 2023-07-26 DIAGNOSIS — J069 Acute upper respiratory infection, unspecified: Secondary | ICD-10-CM | POA: Diagnosis not present

## 2023-07-26 DIAGNOSIS — R093 Abnormal sputum: Secondary | ICD-10-CM | POA: Diagnosis not present

## 2023-07-26 DIAGNOSIS — Z299 Encounter for prophylactic measures, unspecified: Secondary | ICD-10-CM | POA: Diagnosis not present

## 2023-07-31 DIAGNOSIS — J45909 Unspecified asthma, uncomplicated: Secondary | ICD-10-CM | POA: Diagnosis not present

## 2023-07-31 DIAGNOSIS — R0981 Nasal congestion: Secondary | ICD-10-CM | POA: Diagnosis not present

## 2023-07-31 DIAGNOSIS — Z299 Encounter for prophylactic measures, unspecified: Secondary | ICD-10-CM | POA: Diagnosis not present

## 2023-07-31 DIAGNOSIS — R059 Cough, unspecified: Secondary | ICD-10-CM | POA: Diagnosis not present

## 2023-07-31 DIAGNOSIS — R5383 Other fatigue: Secondary | ICD-10-CM | POA: Diagnosis not present

## 2023-08-04 ENCOUNTER — Ambulatory Visit: Payer: Medicare Other | Attending: Cardiology | Admitting: Cardiology

## 2023-08-04 ENCOUNTER — Encounter: Payer: Self-pay | Admitting: Cardiology

## 2023-08-09 DIAGNOSIS — I10 Essential (primary) hypertension: Secondary | ICD-10-CM | POA: Diagnosis not present

## 2023-08-11 DIAGNOSIS — N8111 Cystocele, midline: Secondary | ICD-10-CM | POA: Diagnosis not present

## 2023-08-15 DIAGNOSIS — I10 Essential (primary) hypertension: Secondary | ICD-10-CM | POA: Diagnosis not present

## 2023-08-15 DIAGNOSIS — N1831 Chronic kidney disease, stage 3a: Secondary | ICD-10-CM | POA: Diagnosis not present

## 2023-08-15 DIAGNOSIS — I700 Atherosclerosis of aorta: Secondary | ICD-10-CM | POA: Diagnosis not present

## 2023-08-15 DIAGNOSIS — Z299 Encounter for prophylactic measures, unspecified: Secondary | ICD-10-CM | POA: Diagnosis not present

## 2023-08-22 DIAGNOSIS — I10 Essential (primary) hypertension: Secondary | ICD-10-CM | POA: Diagnosis not present

## 2023-08-22 DIAGNOSIS — N1831 Chronic kidney disease, stage 3a: Secondary | ICD-10-CM | POA: Diagnosis not present

## 2023-08-22 DIAGNOSIS — I700 Atherosclerosis of aorta: Secondary | ICD-10-CM | POA: Diagnosis not present

## 2023-08-22 DIAGNOSIS — Z299 Encounter for prophylactic measures, unspecified: Secondary | ICD-10-CM | POA: Diagnosis not present

## 2023-09-09 DIAGNOSIS — I10 Essential (primary) hypertension: Secondary | ICD-10-CM | POA: Diagnosis not present

## 2023-09-18 NOTE — Progress Notes (Signed)
   09/18/2023  Patient ID: [name], female   DOB: 10/02/1945, 78 y.o.   MRN: [id number]  Contacted patient regarding medication adherence from a quality report for [facility]. The patient is at risk [facility] for 2025.     Per DrFirst fill history: Candesartan 4 mg - last filled 08/22/23 for a 90-day supply.   I will follow up for adherence monitoring.   Thank you for allowing pharmacy to be a part of this patient's care.    Livia Riffle, PharmD Clinical Pharmacist  [phone]

## 2023-09-26 DIAGNOSIS — R001 Bradycardia, unspecified: Secondary | ICD-10-CM | POA: Diagnosis not present

## 2023-09-26 DIAGNOSIS — Z299 Encounter for prophylactic measures, unspecified: Secondary | ICD-10-CM | POA: Diagnosis not present

## 2023-09-26 DIAGNOSIS — M810 Age-related osteoporosis without current pathological fracture: Secondary | ICD-10-CM | POA: Diagnosis not present

## 2023-09-26 DIAGNOSIS — I10 Essential (primary) hypertension: Secondary | ICD-10-CM | POA: Diagnosis not present

## 2023-10-09 DIAGNOSIS — I10 Essential (primary) hypertension: Secondary | ICD-10-CM | POA: Diagnosis not present

## 2023-10-24 DIAGNOSIS — M810 Age-related osteoporosis without current pathological fracture: Secondary | ICD-10-CM | POA: Diagnosis not present

## 2023-10-25 DIAGNOSIS — M810 Age-related osteoporosis without current pathological fracture: Secondary | ICD-10-CM | POA: Diagnosis not present

## 2023-10-25 DIAGNOSIS — Z299 Encounter for prophylactic measures, unspecified: Secondary | ICD-10-CM | POA: Diagnosis not present

## 2023-10-25 DIAGNOSIS — I10 Essential (primary) hypertension: Secondary | ICD-10-CM | POA: Diagnosis not present

## 2023-11-06 ENCOUNTER — Ambulatory Visit: Attending: Cardiology | Admitting: Cardiology

## 2023-11-06 ENCOUNTER — Encounter: Payer: Self-pay | Admitting: Cardiology

## 2023-11-06 VITALS — BP 112/72 | HR 49 | Ht 62.0 in | Wt 121.0 lb

## 2023-11-06 DIAGNOSIS — I10 Essential (primary) hypertension: Secondary | ICD-10-CM | POA: Diagnosis not present

## 2023-11-06 DIAGNOSIS — R001 Bradycardia, unspecified: Secondary | ICD-10-CM

## 2023-11-06 DIAGNOSIS — I2510 Atherosclerotic heart disease of native coronary artery without angina pectoris: Secondary | ICD-10-CM | POA: Diagnosis not present

## 2023-11-06 NOTE — Progress Notes (Signed)
    Cardiology Office Note  Date: 11/06/2023   ID: [name], DOB 1945-11-15, MRN [id number]  History of Present Illness: [name] is a 78 y.o. female last seen in November 2023.  She is here for a follow-up visit.  Reports no exertional chest pain or unusual shortness of breath.  She remains functional with ADLs including mowing her grass.  Also dances 2 days a week.  We went over her home blood pressure checks.  Average blood pressure actually looks fairly good.  She fluctuates from low blood pressure typically in the mornings when it occurs to higher blood pressures in the evenings.  Medications reviewed and have been adjusted by PCP, Atacand is now 4 mg twice daily.  I rechecked her blood pressure today at 112/72.  She does have a component of whitecoat hypertension.  I reviewed her ECG today which shows sinus bradycardia/arrhythmia with low voltage and nonspecific ST changes.  We discussed her last lipid panel.  Physical Exam: VS:  BP 112/72 (BP Location: Right Arm)   Pulse (!) 49   Ht 5' 2 (1.575 m)   Wt 121 lb (54.9 kg)   SpO2 100%   BMI 22.13 kg/m , BMI Body mass index is 22.13 kg/m.  Wt Readings from Last 3 Encounters:  11/06/23 121 lb (54.9 kg)  02/15/22 120 lb 6.4 oz (54.6 kg)  11/02/20 125 lb (56.7 kg)    General: Patient appears comfortable at rest. HEENT: Conjunctiva and lids normal. Neck: Supple, no elevated JVP or carotid bruits. Lungs: Clear to auscultation, nonlabored breathing at rest. Cardiac: Regular rate and rhythm, no S3 or significant systolic murmur. Extremities: No pitting edema.  ECG:  An ECG dated 02/15/2022 was personally reviewed today and demonstrated:  Sinus bradycardia with low voltage and nonspecific ST changes.  Labwork:  September 2024: Cholesterol 191, triglycerides 150, HDL 67, LDL 98, TSH 11 September 2023: BUN 11, creatinine 0.76, potassium 4.7, AST 26, ALT 16, hemoglobin 14.2, platelets 282  Other Studies Reviewed  Today:  No interval cardiac testing for review today.  Assessment and Plan:  1.  Mild CAD by cardiac catheterization in August 2015.  She remains asymptomatic, no change in stamina with activities discussed above.  ECG reviewed and stable.  She remains on aspirin  81 mg daily.  I did talk with her about considering addition of low-dose statin for additional risk reduction.  She will follow-up with PCP this year for repeat lipid panel.  Her HDL was 67 with LDL 98 in September 2024.   2.  Sinus bradycardia, asymptomatic.   3.  Elevated blood pressure with suspected whitecoat hypertension.  I reviewed her home blood pressure checks including repeat measurement today in the office, continue Atacand 4 mg twice daily at this point.  Disposition:  Follow up 1 year.  Signed, Jayson JUDITHANN Sierras, M.D., F.A.C.C. [facility] at [facility]

## 2023-11-06 NOTE — Patient Instructions (Signed)
 Medication Instructions:  Your physician recommends that you continue on your current medications as directed. Please refer to the Current Medication list given to you today.   Labwork: None today  Testing/Procedures: None toady  Follow-Up: 1 year with Dr.McDowell  Any Other Special Instructions Will Be Listed Below (If Applicable).  If you need a refill on your cardiac medications before your next appointment, please call your pharmacy.

## 2023-11-09 DIAGNOSIS — I10 Essential (primary) hypertension: Secondary | ICD-10-CM | POA: Diagnosis not present

## 2023-11-18 IMAGING — MG MM DIGITAL SCREENING BILAT W/ TOMO AND CAD
8 series · 9 of 24 positions shown · non-contrast
Comparison: Previous exam(s).

CLINICAL DATA: Screening.

EXAM:
DIGITAL SCREENING BILATERAL MAMMOGRAM WITH TOMOSYNTHESIS AND CAD
TECHNIQUE: Bilateral screening digital craniocaudal and mediolateral oblique
mammograms were obtained. Bilateral screening digital breast
tomosynthesis was performed. The images were evaluated with
computer-aided detection.

[L MLO synth-2D]
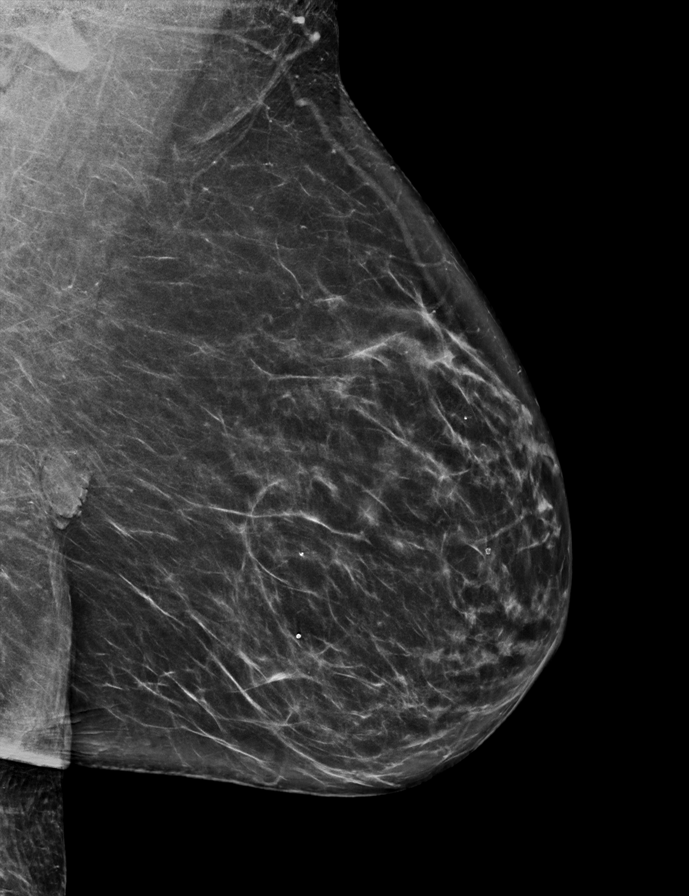

[L CC synth-2D]
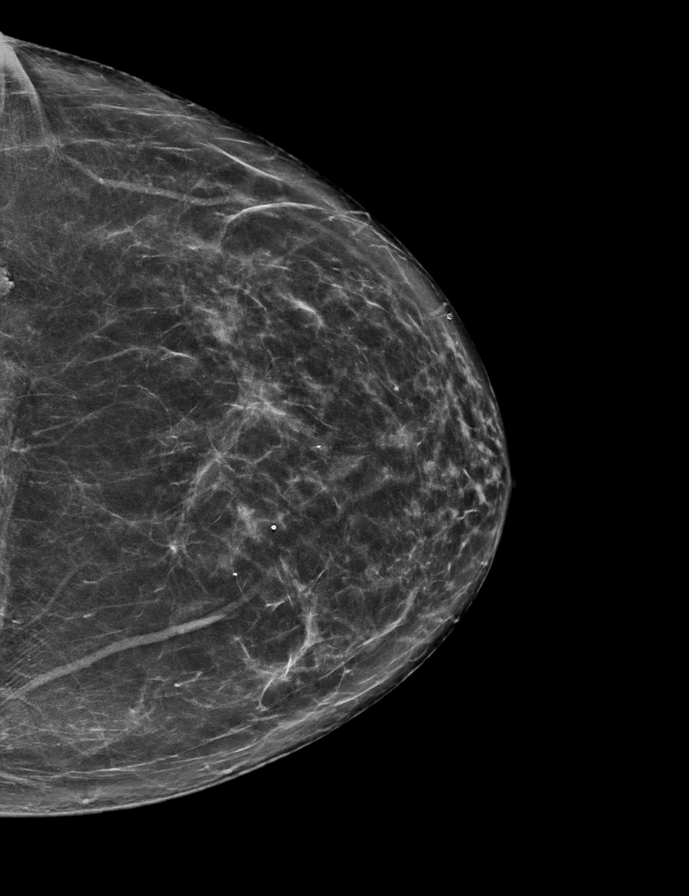

[R MLO synth-2D]
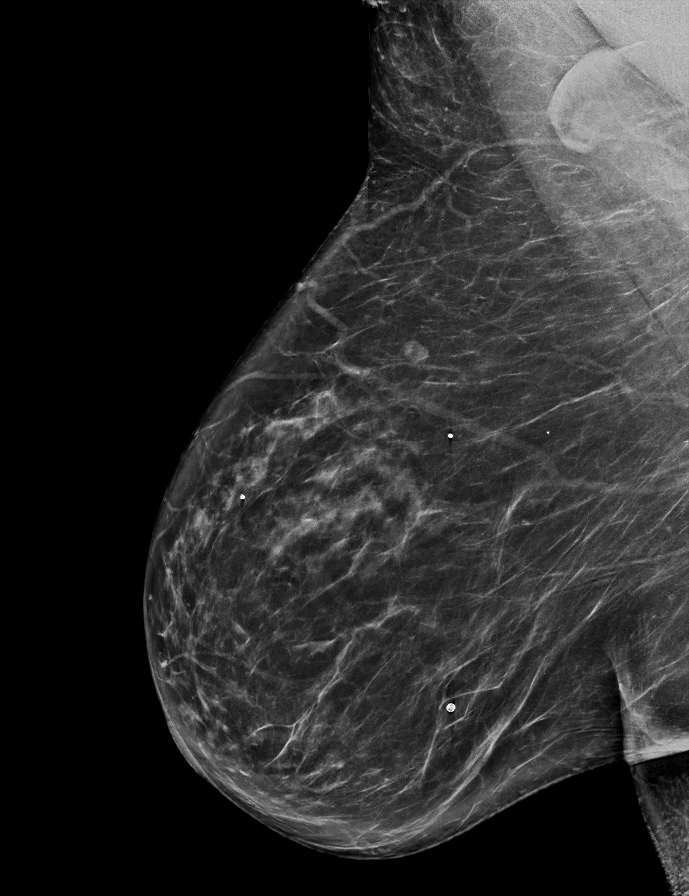

[R CC synth-2D]
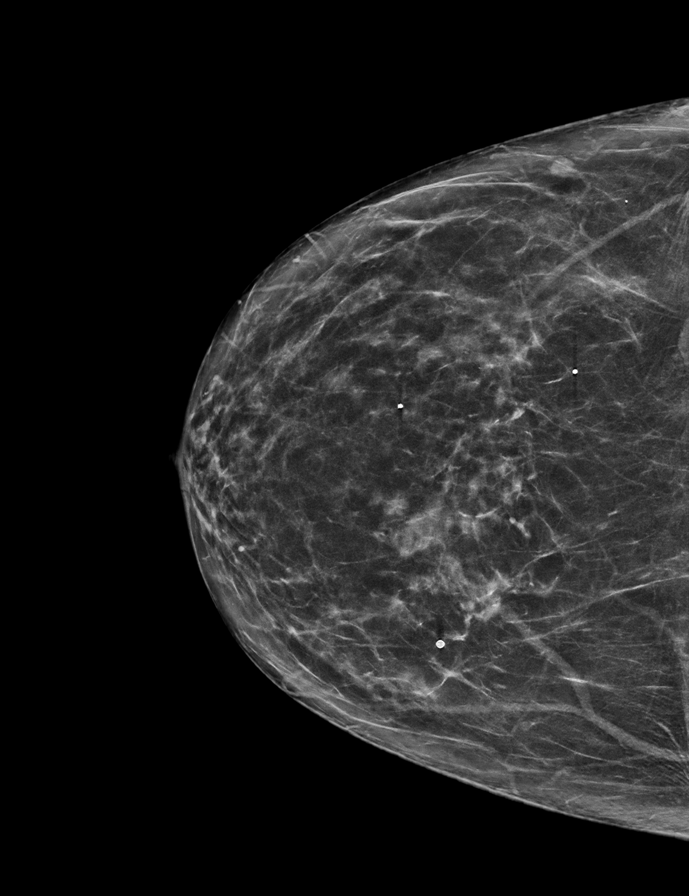

[R CC tomo · 2 of 67 frames shown]
[frame 22/67]
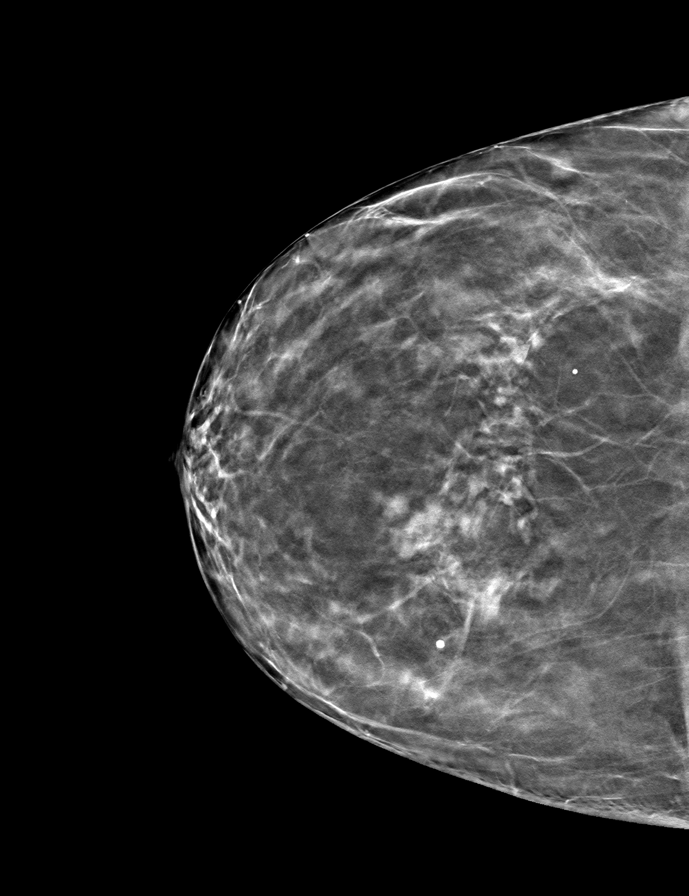
[frame 34/67]
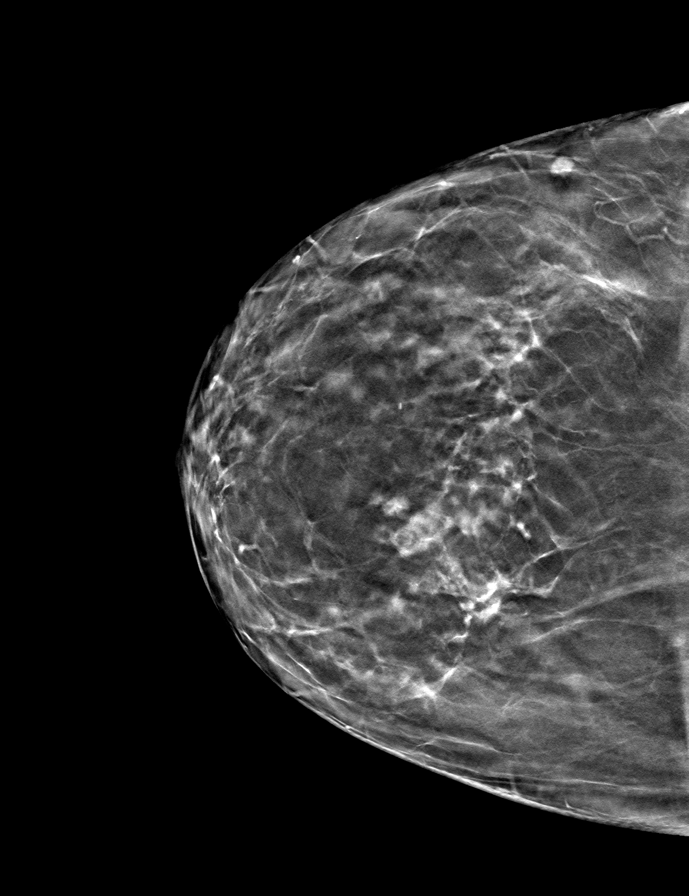

[L MLO tomo · tomo slice 36/71.0]
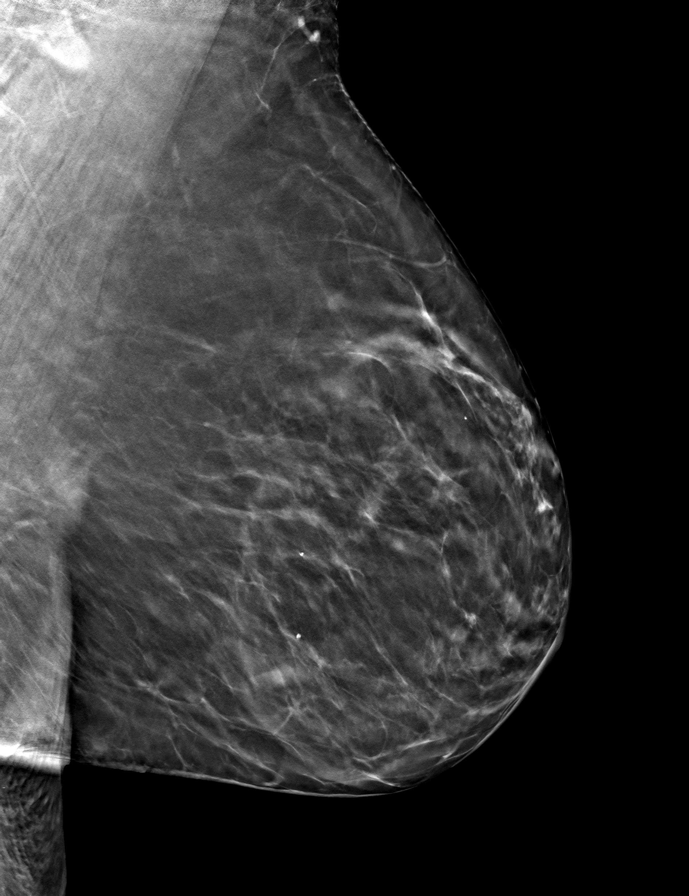

[L CC tomo · tomo slice 35/69.0]
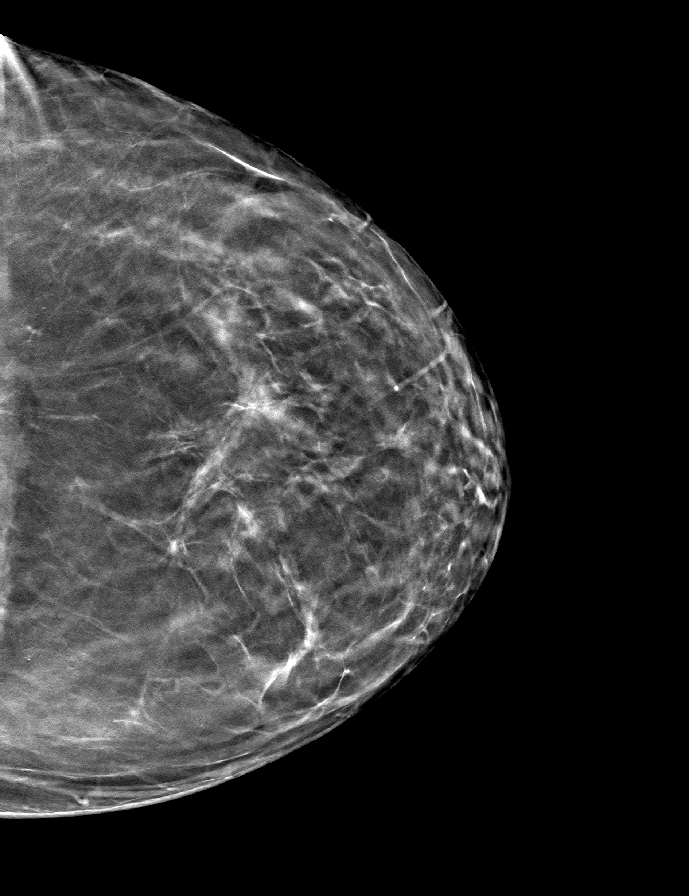

[R MLO tomo · tomo slice 39/78.0]
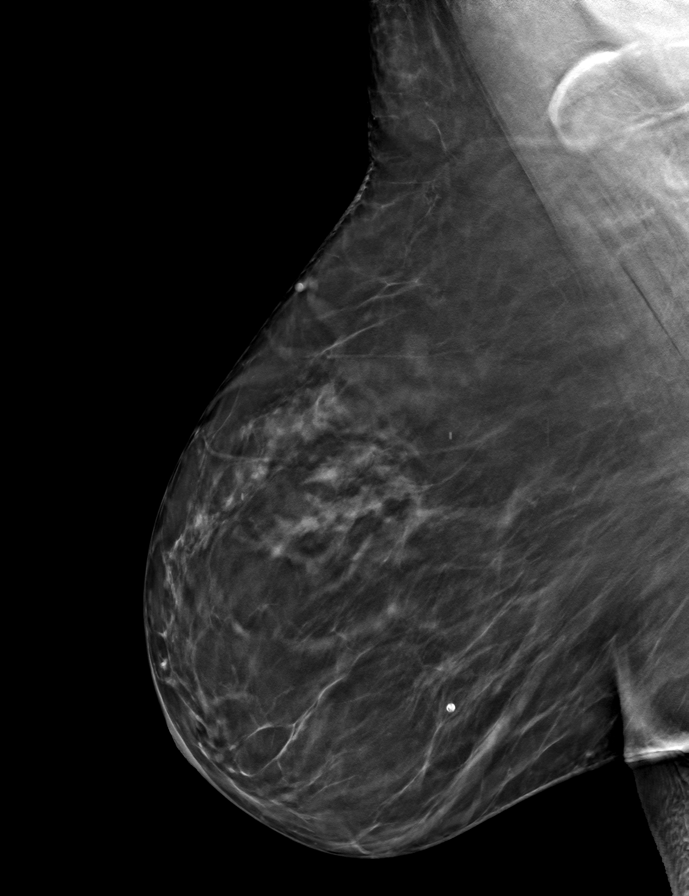

[9 of 24 positions shown; findings below may reference images not displayed]

ACR Breast Density Category b: There are scattered areas of
fibroglandular density.
FINDINGS: There are no findings suspicious for malignancy.
IMPRESSION: No mammographic evidence of malignancy. A result letter of this
screening mammogram will be mailed directly to the patient.

RECOMMENDATION:
Screening mammogram in one year. (Code:51-O-LD2)

BI-RADS CATEGORY  1: Negative.

## 2023-11-20 DIAGNOSIS — N8111 Cystocele, midline: Secondary | ICD-10-CM | POA: Diagnosis not present

## 2023-12-09 DIAGNOSIS — I10 Essential (primary) hypertension: Secondary | ICD-10-CM | POA: Diagnosis not present

## 2023-12-26 DIAGNOSIS — R5383 Other fatigue: Secondary | ICD-10-CM | POA: Diagnosis not present

## 2023-12-26 DIAGNOSIS — Z0000 Encounter for general adult medical examination without abnormal findings: Secondary | ICD-10-CM | POA: Diagnosis not present

## 2023-12-26 DIAGNOSIS — M810 Age-related osteoporosis without current pathological fracture: Secondary | ICD-10-CM | POA: Diagnosis not present

## 2023-12-26 DIAGNOSIS — Z1389 Encounter for screening for other disorder: Secondary | ICD-10-CM | POA: Diagnosis not present

## 2023-12-26 DIAGNOSIS — Z79899 Other long term (current) drug therapy: Secondary | ICD-10-CM | POA: Diagnosis not present

## 2023-12-26 DIAGNOSIS — E7800 Pure hypercholesterolemia, unspecified: Secondary | ICD-10-CM | POA: Diagnosis not present

## 2023-12-26 DIAGNOSIS — Z299 Encounter for prophylactic measures, unspecified: Secondary | ICD-10-CM | POA: Diagnosis not present

## 2023-12-26 DIAGNOSIS — Z23 Encounter for immunization: Secondary | ICD-10-CM | POA: Diagnosis not present

## 2023-12-26 DIAGNOSIS — Z7189 Other specified counseling: Secondary | ICD-10-CM | POA: Diagnosis not present

## 2023-12-26 DIAGNOSIS — I10 Essential (primary) hypertension: Secondary | ICD-10-CM | POA: Diagnosis not present

## 2024-01-09 DIAGNOSIS — I10 Essential (primary) hypertension: Secondary | ICD-10-CM | POA: Diagnosis not present
